# Patient Record
Sex: Male | Born: 1935 | Race: White | Hispanic: No | Marital: Married | Smoking: Never smoker
Health system: Southern US, Community
[De-identification: ages and names within clinical notes are randomized; demographics above are authoritative.]

## PROBLEM LIST (undated history)

## (undated) DIAGNOSIS — I10 Essential (primary) hypertension: Secondary | ICD-10-CM

## (undated) DIAGNOSIS — F0390 Unspecified dementia without behavioral disturbance: Secondary | ICD-10-CM

---

## 2020-02-18 ENCOUNTER — Ambulatory Visit: Payer: Self-pay | Admitting: Podiatry

## 2020-03-03 ENCOUNTER — Ambulatory Visit: Payer: Self-pay | Admitting: Podiatry

## 2020-03-10 ENCOUNTER — Ambulatory Visit: Payer: Self-pay | Admitting: Podiatry

## 2020-09-09 ENCOUNTER — Other Ambulatory Visit: Payer: Self-pay

## 2020-09-09 ENCOUNTER — Encounter: Payer: Self-pay | Admitting: *Deleted

## 2020-09-09 DIAGNOSIS — I10 Essential (primary) hypertension: Secondary | ICD-10-CM | POA: Insufficient documentation

## 2020-09-09 DIAGNOSIS — E7220 Disorder of urea cycle metabolism, unspecified: Secondary | ICD-10-CM | POA: Insufficient documentation

## 2020-09-09 DIAGNOSIS — B9689 Other specified bacterial agents as the cause of diseases classified elsewhere: Secondary | ICD-10-CM | POA: Diagnosis not present

## 2020-09-09 DIAGNOSIS — N390 Urinary tract infection, site not specified: Secondary | ICD-10-CM | POA: Insufficient documentation

## 2020-09-09 DIAGNOSIS — F0390 Unspecified dementia without behavioral disturbance: Secondary | ICD-10-CM | POA: Diagnosis not present

## 2020-09-09 LAB — COMPREHENSIVE METABOLIC PANEL
ALT: 21 U/L (ref 0–44)
AST: 27 U/L (ref 15–41)
Albumin: 4.1 g/dL (ref 3.5–5.0)
Alkaline Phosphatase: 46 U/L (ref 38–126)
Anion gap: 7 (ref 5–15)
BUN: 25 mg/dL — ABNORMAL HIGH (ref 8–23)
CO2: 27 mmol/L (ref 22–32)
Calcium: 9.4 mg/dL (ref 8.9–10.3)
Chloride: 104 mmol/L (ref 98–111)
Creatinine, Ser: 0.98 mg/dL (ref 0.61–1.24)
GFR, Estimated: >60 mL/min (ref >60)
Glucose, Bld: 157 mg/dL — ABNORMAL HIGH (ref 70–99)
Potassium: 4.2 mmol/L (ref 3.5–5.1)
Sodium: 138 mmol/L (ref 135–145)
Total Bilirubin: 0.8 mg/dL (ref 0.3–1.2)
Total Protein: 7 g/dL (ref 6.5–8.1)

## 2020-09-09 LAB — URINALYSIS, COMPLETE (UACMP) WITH MICROSCOPIC
Bacteria, UA: NONE SEEN
Bilirubin Urine: NEGATIVE
Glucose, UA: NEGATIVE mg/dL
Hgb urine dipstick: NEGATIVE
Ketones, ur: 5 mg/dL — AB
Nitrite: POSITIVE — AB
Protein, ur: NEGATIVE mg/dL
Specific Gravity, Urine: 1.026 (ref 1.005–1.030)
WBC, UA: >50 WBC/hpf — ABNORMAL HIGH (ref 0–5)
pH: 5 (ref 5.0–8.0)

## 2020-09-09 LAB — CBC
HCT: 40.2 % (ref 39.0–52.0)
Hemoglobin: 13.1 g/dL (ref 13.0–17.0)
MCH: 32 pg (ref 26.0–34.0)
MCHC: 32.6 g/dL (ref 30.0–36.0)
MCV: 98 fL (ref 80.0–100.0)
Platelets: 158 10*3/uL (ref 150–400)
RBC: 4.1 MIL/uL — ABNORMAL LOW (ref 4.22–5.81)
RDW: 12.4 % (ref 11.5–15.5)
WBC: 6.6 10*3/uL (ref 4.0–10.5)
nRBC: 0 % (ref 0.0–0.2)

## 2020-09-09 LAB — AMMONIA: Ammonia: 33 umol/L (ref 9–35)

## 2020-09-09 NOTE — ED Notes (Signed)
Pt ambulated to bathroom independently

## 2020-09-09 NOTE — ED Triage Notes (Signed)
Pt says he has to urinate a lot.

## 2020-09-09 NOTE — ED Triage Notes (Signed)
Pt arrives via [facility] from [facility] facility. Brought here for abnormal ammonia levels. Pt is alert, confused (hx of dementia). Responds only to "[facility]". Vitals 167/77, hr 78, sats 98%.

## 2020-09-09 NOTE — ED Notes (Signed)
Pt ambulated to bathroom independently to bathroom

## 2020-09-10 ENCOUNTER — Emergency Department
Admission: EM | Admit: 2020-09-10 | Discharge: 2020-09-10 | Disposition: A | Discharge status: Home / Self Care | Payer: Medicare PPO | Attending: Emergency Medicine | Admitting: Emergency Medicine

## 2020-09-10 DIAGNOSIS — N390 Urinary tract infection, site not specified: Secondary | ICD-10-CM

## 2020-09-10 HISTORY — DX: Unspecified dementia, unspecified severity, without behavioral disturbance, psychotic disturbance, mood disturbance, and anxiety: F03.90

## 2020-09-10 HISTORY — DX: Essential (primary) hypertension: I10

## 2020-09-10 MED ORDER — CEPHALEXIN 500 MG PO CAPS: 500.0000 mg | ORAL_CAPSULE | Freq: Two times a day (BID) | ORAL | 0 refills | Status: AC

## 2020-09-10 MED ORDER — CEPHALEXIN 500 MG PO CAPS
500.0000 mg | ORAL_CAPSULE | Freq: Once | ORAL | Status: AC
  Administered 2020-09-10: 500 mg via ORAL
  Filled 2020-09-10: qty 1

## 2020-09-10 NOTE — Discharge Instructions (Addendum)
Scott Montgomery ammonia level is normal today (33).  His other blood work is also normal.  His urine is showing findings of urinary tract infection.  We have prescribed a 7-day course of antibiotics.  He should return to the ER for any new or worsening urinary symptoms, abdominal or flank pain, fever, vomiting, weakness, or any other new or worsening symptoms that are concerning

## 2020-09-10 NOTE — ED Provider Notes (Signed)
[facility] Emergency Department Provider Note ____________________________________________   Event Date/Time   First MD Initiated Contact with Patient 09/10/20 0005     (approximate)  I have reviewed the triage vital signs and the nursing notes.   HISTORY  Chief Complaint Abnormal Labs  Level 5 caveat: History of present illness limited due to dementia  HPI Comer Devins is a 87 y.o. male with PMH as noted below including hypertension and dementia who presents from [facility] due to an apparent elevated ammonia level on recent labs there.  The patient himself initially stated that he felt fine and had no complaints although when I asked him further, he reported that he has been "urinating a lot."  He denies any pain, nausea or vomiting, or other acute symptoms.  Of note another patient was sent to the ED tonight from [facility] with an elevated ammonia level there and a normal level here.  Past Medical History:  Diagnosis Date   Dementia (HCC)    Hypertension     There are no problems to display for this patient.    Prior to Admission medications   Medication Sig Start Date End Date Taking? Authorizing Provider  cephALEXin (KEFLEX) 500 MG capsule Take 1 capsule (500 mg total) by mouth 2 (two) times daily for 7 days. 09/10/20 09/17/20 Yes Dionne Bucy, MD    Allergies Patient has no known allergies.  No family history on file.  Social History    Review of Systems Level 5 caveat: Unable to obtain review of systems due to dementia   ____________________________________________   PHYSICAL EXAM:  VITAL SIGNS: ED Triage Vitals  Enc Vitals Group     BP 09/09/20 1941 (!) 156/87     Pulse Rate 09/09/20 1941 63     Resp 09/09/20 1941 16     Temp 09/09/20 1941 98.6 F (37 C)     Temp Source 09/09/20 1941 Oral     SpO2 09/09/20 1941 96 %     Weight --      Height --      Head Circumference --      Peak Flow --       Pain Score 09/09/20 1940 0     Pain Loc --      Pain Edu? --      Excl. in GC? --     Constitutional: Alert, oriented x1.  Well appearing and in no acute distress. Eyes: Conjunctivae are normal.  Head: Atraumatic. Nose: No congestion/rhinnorhea. Mouth/Throat: Mucous membranes are moist.   Neck: Normal range of motion.  Cardiovascular: Normal rate, regular rhythm. Good peripheral circulation. Respiratory: Normal respiratory effort.  No retractions.  Gastrointestinal: Soft and nontender. No distention.  Genitourinary: No CVA tenderness. Musculoskeletal: Extremities warm and well perfused.  Neurologic:  Normal speech and language. No gross focal neurologic deficits are appreciated.  Skin:  Skin is warm and dry. No rash noted. Psychiatric: Mood and affect are normal. Speech and behavior are normal.  ____________________________________________   LABS (all labs ordered are listed, but only abnormal results are displayed)  Labs Reviewed  CBC - Abnormal; Notable for the following components:      Result Value   RBC 4.10 (*)    All other components within normal limits  COMPREHENSIVE METABOLIC PANEL - Abnormal; Notable for the following components:   Glucose, Bld 157 (*)    BUN 25 (*)    All other components within normal limits  URINALYSIS, COMPLETE (UACMP)  WITH MICROSCOPIC - Abnormal; Notable for the following components:   Color, Urine YELLOW (*)    APPearance HAZY (*)    Ketones, ur 5 (*)    Nitrite POSITIVE (*)    Leukocytes,Ua MODERATE (*)    WBC, UA >50 (*)    All other components within normal limits  AMMONIA   ____________________________________________  EKG   ____________________________________________  RADIOLOGY    ____________________________________________   PROCEDURES  Procedure(s) performed: No  Procedures  Critical Care performed: No ____________________________________________   INITIAL IMPRESSION / ASSESSMENT AND PLAN / ED  COURSE  Pertinent labs & imaging results that were available during my care of the patient were reviewed by me and considered in my medical decision making (see chart for details).   87 year old male with PMH as noted above presents from his facility after recent labs apparently revealed an elevated ammonia level.  It is not clear that the patient had any specific symptoms that were being worked up.  Of note, another patient was sent from [facility] tonight with the same issue and also had a normal ammonia here.  On exam, the patient is very well-appearing for his age.  He is alert, talkative, and ambulates with steady gait.  He is oriented only x1 although able to answer most questions and follow commands appropriately.  He does endorse some urinary frequency but denies other acute symptoms.  The ammonia level here is normal and the lab work-up is otherwise unremarkable except for the urinalysis, which shows significant WBCs and is positive for nitrites.  This is concerning for UTI.  The patient has no leukocytosis or fever, however given the fact that he does endorse urinary symptoms we will treat empirically for UTI.  At this time, the patient is stable for discharge back to his facility.  There is no indication for further the ED work-up or observation.  I will give a dose of Keflex here and discharged with a 1 week course of the same.  Return precautions and discharge instructions have been provided.  ____________________________________________   FINAL CLINICAL IMPRESSION(S) / ED DIAGNOSES  Final diagnoses:  Urinary tract infection without hematuria, site unspecified      NEW MEDICATIONS STARTED DURING THIS VISIT:  New Prescriptions   CEPHALEXIN (KEFLEX) 500 MG CAPSULE    Take 1 capsule (500 mg total) by mouth 2 (two) times daily for 7 days.     Note:  This document was prepared using Dragon voice recognition software and may include unintentional dictation errors.     Dionne Bucy, MD 09/10/20 [phone]

## 2020-09-10 NOTE — ED Notes (Signed)
Patient denies pain.

## 2020-10-16 ENCOUNTER — Emergency Department
Admission: EM | Admit: 2020-10-16 | Discharge: 2020-10-16 | Disposition: A | Discharge status: Home / Self Care | Payer: No Typology Code available for payment source | Source: Home / Self Care | Attending: Emergency Medicine | Admitting: Emergency Medicine

## 2020-10-16 ENCOUNTER — Emergency Department: Payer: No Typology Code available for payment source

## 2020-10-16 ENCOUNTER — Encounter: Payer: Self-pay | Admitting: Emergency Medicine

## 2020-10-16 ENCOUNTER — Other Ambulatory Visit: Payer: Self-pay

## 2020-10-16 DIAGNOSIS — F0391 Unspecified dementia with behavioral disturbance: Secondary | ICD-10-CM | POA: Diagnosis not present

## 2020-10-16 DIAGNOSIS — G9341 Metabolic encephalopathy: Secondary | ICD-10-CM | POA: Diagnosis not present

## 2020-10-16 DIAGNOSIS — R531 Weakness: Secondary | ICD-10-CM | POA: Insufficient documentation

## 2020-10-16 DIAGNOSIS — I10 Essential (primary) hypertension: Secondary | ICD-10-CM | POA: Insufficient documentation

## 2020-10-16 DIAGNOSIS — N3000 Acute cystitis without hematuria: Secondary | ICD-10-CM | POA: Insufficient documentation

## 2020-10-16 DIAGNOSIS — F0390 Unspecified dementia without behavioral disturbance: Secondary | ICD-10-CM | POA: Insufficient documentation

## 2020-10-16 LAB — URINALYSIS, COMPLETE (UACMP) WITH MICROSCOPIC
Bilirubin Urine: NEGATIVE
Glucose, UA: NEGATIVE mg/dL
Ketones, ur: 20 mg/dL — AB
Nitrite: POSITIVE — AB
Protein, ur: NEGATIVE mg/dL
Specific Gravity, Urine: 1.024 (ref 1.005–1.030)
Squamous Epithelial / HPF: NONE SEEN (ref 0–5)
pH: 5 (ref 5.0–8.0)

## 2020-10-16 LAB — BASIC METABOLIC PANEL
Anion gap: 10 (ref 5–15)
BUN: 23 mg/dL (ref 8–23)
CO2: 25 mmol/L (ref 22–32)
Calcium: 9.5 mg/dL (ref 8.9–10.3)
Chloride: 106 mmol/L (ref 98–111)
Creatinine, Ser: 1.03 mg/dL (ref 0.61–1.24)
GFR, Estimated: >60 mL/min (ref >60)
Glucose, Bld: 122 mg/dL — ABNORMAL HIGH (ref 70–99)
Potassium: 3.9 mmol/L (ref 3.5–5.1)
Sodium: 141 mmol/L (ref 135–145)

## 2020-10-16 LAB — CBC
HCT: 40 % (ref 39.0–52.0)
Hemoglobin: 13.5 g/dL (ref 13.0–17.0)
MCH: 32.8 pg (ref 26.0–34.0)
MCHC: 33.8 g/dL (ref 30.0–36.0)
MCV: 97.3 fL (ref 80.0–100.0)
Platelets: 153 10*3/uL (ref 150–400)
RBC: 4.11 MIL/uL — ABNORMAL LOW (ref 4.22–5.81)
RDW: 12.1 % (ref 11.5–15.5)
WBC: 9.4 10*3/uL (ref 4.0–10.5)
nRBC: 0 % (ref 0.0–0.2)

## 2020-10-16 MED ORDER — HALOPERIDOL LACTATE 5 MG/ML IJ SOLN
2.5000 mg | Freq: Once | INTRAMUSCULAR | Status: AC
  Administered 2020-10-16: 2.5 mg via INTRAVENOUS

## 2020-10-16 MED ORDER — LORAZEPAM 2 MG/ML IJ SOLN
0.5000 mg | Freq: Once | INTRAMUSCULAR | Status: AC
  Administered 2020-10-16: 0.5 mg via INTRAVENOUS
  Filled 2020-10-16: qty 1

## 2020-10-16 MED ORDER — HALOPERIDOL LACTATE 5 MG/ML IJ SOLN
2.5000 mg | Freq: Once | INTRAMUSCULAR | Status: DC
  Filled 2020-10-16: qty 1

## 2020-10-16 MED ORDER — HALOPERIDOL LACTATE 5 MG/ML IJ SOLN
2.0000 mg | Freq: Four times a day (QID) | INTRAMUSCULAR | Status: DC | PRN
  Administered 2020-10-16: 2 mg via INTRAVENOUS

## 2020-10-16 MED ORDER — CEPHALEXIN 500 MG PO CAPS: 500.0000 mg | ORAL_CAPSULE | Freq: Four times a day (QID) | ORAL | 0 refills | Status: DC

## 2020-10-16 MED ORDER — SODIUM CHLORIDE 0.9 % IV SOLN
1.0000 g | Freq: Once | INTRAVENOUS | Status: AC
  Administered 2020-10-16: 1 g via INTRAVENOUS
  Filled 2020-10-16: qty 10

## 2020-10-16 NOTE — ED Provider Notes (Signed)
[facility] Emergency Department Provider Note  ____________________________________________   Event Date/Time   First MD Initiated Contact with Patient 10/16/20 1334     (approximate)  I have reviewed the triage vital signs and the nursing notes.   HISTORY  Chief Complaint Weakness  HPI provided by facility staff. Patient reports he is here "for a check-up."  HPI Scott Montgomery is a 87 y.o. male with a history of dementia and hypertension, presents to the ED from [facility], assisted-living facility.  He presents to the ED via EMS with complaints of generalized weakness that began after lunch today.  According to staff, patient reported weakness with standing.  There was no reported syncope, speech deficit, paralysis.  Patient is at baseline according to EMS.  Patient denies any complaints at this time.  He does present with urine soaked pants, completely damp to the hymns with urine.  Patient declines offers to clean and change them at this time, remarking that "I like these pants, I am okay."  Discussed patient presentation with daughter-in-law, Johnny Bridge. She confirms that patient defiance and agitation is baseline. He has refused to allow staff to bathe him or change his clothes.     Past Medical History:  Diagnosis Date   Dementia (HCC)    Hypertension     There are no problems to display for this patient.   History reviewed. No pertinent surgical history.  Prior to Admission medications   Medication Sig Start Date End Date Taking? Authorizing Provider  cephALEXin (KEFLEX) 500 MG capsule Take 1 capsule (500 mg total) by mouth 4 (four) times daily for 5 days. 10/16/20 10/21/20 Yes Gilles Chiquito, MD    Allergies Patient has no known allergies.  History reviewed. No pertinent family history.  Social History    Review of Systems  Constitutional: No fever/chills Eyes: No visual changes. ENT: No sore throat. Cardiovascular:  Denies chest pain. Respiratory: Denies shortness of breath. Gastrointestinal: No abdominal pain.  No nausea, no vomiting.  No diarrhea.  No constipation. Genitourinary: Negative for dysuria. Musculoskeletal: Negative for back pain. Skin: Negative for rash. Neurological: Negative for headaches, focal weakness or numbness. ____________________________________________   PHYSICAL EXAM:  VITAL SIGNS: ED Triage Vitals  Enc Vitals Group     BP 10/16/20 1304 130/67     Pulse Rate 10/16/20 1304 97     Resp 10/16/20 1304 16     Temp 10/16/20 1304 98.6 F (37 C)     Temp Source 10/16/20 1304 Oral     SpO2 10/16/20 1304 97 %     Weight 10/16/20 1314 142 lb (64.4 kg)     Height 10/16/20 1314 5\' 5"  (1.651 m)     Head Circumference --      Peak Flow --      Pain Score 10/16/20 1314 0     Pain Loc --      Pain Edu? --      Excl. in GC? --     Constitutional: Alert and oriented. Well appearing and in no acute distress. Eyes: Conjunctivae are normal. PERRL. EOMI. Head: Atraumatic. Mouth/Throat: Mucous membranes are moist.  Oropharynx non-erythematous. Neck: No stridor.   Cardiovascular: Normal rate, regular rhythm. Grade II systolic murmur.  Good peripheral circulation. Respiratory: Normal respiratory effort.  No retractions. Lungs CTAB. Gastrointestinal: Soft and nontender. No distention. No abdominal bruits. No CVA tenderness. Musculoskeletal: No lower extremity tenderness nor edema.  No joint effusions. Neurologic:  Normal speech and language. No  gross focal neurologic deficits are appreciated. No gait instability. Skin:  Skin is warm, dry and intact. No rash noted. Hypertrophic toenails, dry peeling skin of feet.  Psychiatric: Mood and affect are normal. Speech is normal and behavior are defiant.   ____________________________________________   LABS (all labs ordered are listed, but only abnormal results are displayed)  Labs Reviewed  BASIC METABOLIC PANEL - Abnormal; Notable for  the following components:      Result Value   Glucose, Bld 122 (*)    All other components within normal limits  CBC - Abnormal; Notable for the following components:   RBC 4.11 (*)    All other components within normal limits  URINALYSIS, COMPLETE (UACMP) WITH MICROSCOPIC - Abnormal; Notable for the following components:   Color, Urine AMBER (*)    APPearance HAZY (*)    Hgb urine dipstick SMALL (*)    Ketones, ur 20 (*)    Nitrite POSITIVE (*)    Leukocytes,Ua MODERATE (*)    Bacteria, UA MANY (*)    All other components within normal limits  URINE CULTURE  CBG MONITORING, ED   ____________________________________________  EKG  Vent. rate 88 BPM PR interval 165 ms QRS duration 82 ms QT/QTcB 372/451 ms P-R-T axes 69 67 65 NSR No STEMI ____________________________________________  RADIOLOGY I, Lissa Hoard, personally viewed and evaluated these images (plain radiographs) as part of my medical decision making, as well as reviewing the written report by the radiologist.  ED MD interpretation:  agree with report  Official radiology report(s): CT Head Wo Contrast  Result Date: 10/16/2020 CLINICAL DATA:  Head trauma, minor (Age >= 65y) fall EXAM: CT HEAD WITHOUT CONTRAST TECHNIQUE: Contiguous axial images were obtained from the base of the skull through the vertex without intravenous contrast. COMPARISON:  None. FINDINGS: Brain: There is atrophy and chronic small vessel disease changes. No acute intracranial abnormality. Specifically, no hemorrhage, hydrocephalus, mass lesion, acute infarction, or significant intracranial injury. Vascular: No hyperdense vessel or unexpected calcification. Skull: No acute calvarial abnormality. Sinuses/Orbits: No acute findings Other: None IMPRESSION: Atrophy, chronic microvascular disease. No acute intracranial abnormality. Electronically Signed   By: Charlett Nose M.D.   On: 10/16/2020 16:42   CT Cervical Spine Wo Contrast  Result  Date: 10/16/2020 CLINICAL DATA:  Neck trauma (Age >= 65y).  Fall. EXAM: CT CERVICAL SPINE WITHOUT CONTRAST TECHNIQUE: Multidetector CT imaging of the cervical spine was performed without intravenous contrast. Multiplanar CT image reconstructions were also generated. COMPARISON:  None. FINDINGS: Alignment: No subluxation Skull base and vertebrae: No acute fracture. No primary bone lesion or focal pathologic process. Soft tissues and spinal canal: No prevertebral fluid or swelling. No visible canal hematoma. Disc levels: Diffuse degenerative disc disease, most pronounced from C5-6 through C7-T1. Moderate to advanced bilateral degenerative facet disease diffusely, left greater than right. Upper chest: No acute findings.  Biapical scarring. Other: None IMPRESSION: Degenerative disc and facet disease.  No acute bony abnormality. Electronically Signed   By: Charlett Nose M.D.   On: 10/16/2020 16:43   DG Chest Portable 1 View  Result Date: 10/16/2020 CLINICAL DATA:  Onset generalized weakness after lunch today. EXAM: PORTABLE CHEST 1 VIEW COMPARISON:  None. FINDINGS: There is mild elevation of the left hemidiaphragm relative to the right with some left basilar atelectasis. Lungs are otherwise clear. No pneumothorax or pleural fluid. Heart size is normal. The patient has a fracture of the posterior arc of the left sixth rib which is age indeterminate. IMPRESSION: No  acute disease. Age indeterminate fracture left sixth rib. Electronically Signed   By: Drusilla Kanner M.D.   On: 10/16/2020 15:24    ____________________________________________   PROCEDURES  Procedure(s) performed (including Critical Care):  Procedures  Rocephin 1 g IVPB Haloperidol 2 mg IVP Ativan 0.5 mg IV Haldol 2.5 mg IV. Lorazepam 0.5 mg IV ____________________________________________   INITIAL IMPRESSION / ASSESSMENT AND PLAN / ED COURSE  As part of my medical decision making, I reviewed the following data within the electronic  MEDICAL RECORD NUMBER History obtained from family, Labs reviewed UTI confirmed, EKG interpreted NSR, Radiograph reviewed WNL, Evaluated by EM attending C. Erma Heritage, MD, and Notes from prior ED visits   Differential diagnosis includes, but is not limited to, alcohol, illicit or prescription medications, or other toxic ingestion; intracranial pathology such as stroke or intracerebral hemorrhage; fever or infectious causes including sepsis; hypoxemia and/or hypercarbia; uremia; trauma; endocrine related disorders such as diabetes, hypoglycemia, and thyroid-related diseases; hypertensive encephalopathy; etc.  Geriatric patient presents to the ED for evaluation of weakness.  Patient did become agitated and combative with staff attempting to evaluate him.  He was given Haldol lorazepam in the ED to help with his agitation.  He has been stable since that time.  He was evaluated for his complaints, despite being a poor historian.  He was found to have a normal head and neck CT and the chest is rated at revealing acute intrathoracic process.  His urinalysis did reveal some mild bacteria.  Patient was treated empirically for UTI at this time.  He is discharged otherwise stable condition back to his facility for routine follow-up with his PCP. ____________________________________________   FINAL CLINICAL IMPRESSION(S) / ED DIAGNOSES  Final diagnoses:  Acute cystitis without hematuria     ED Discharge Orders          Ordered    cephALEXin (KEFLEX) 500 MG capsule  4 times daily        10/16/20 1652             Note:  This document was prepared using Dragon voice recognition software and may include unintentional dictation errors.    Lissa Hoard, PA-C 10/16/20 2017    Shaune Pollack, MD 10/16/20 2157

## 2020-10-16 NOTE — ED Notes (Signed)
Called [facility] for transport to [facility]

## 2020-10-16 NOTE — ED Notes (Signed)
Pt pulled out IV from right wrist. Tech cleaned the site an now pt is back sleeping waiting on [facility] to pick him up to go back to [facility]

## 2020-10-16 NOTE — ED Notes (Signed)
RN attempted to call Report to [facility]. Talked with Tresa Endo who stated no pt with this last name is a resident there. Charge RN made aware.

## 2020-10-16 NOTE — ED Notes (Signed)
Patient is now sleeping, waiting for transport.

## 2020-10-16 NOTE — ED Notes (Signed)
Recruitment consultant (ED tech) at bedside. Patient is waiting for transportation back to SNF. Patient is restless on the stretcher, throwing legs over the side. Patient has removed his B/P cuff and cardiac leads.

## 2020-10-16 NOTE — ED Notes (Signed)
EMS is here for patient.

## 2020-10-16 NOTE — ED Notes (Signed)
This RN called and updated daughter in law Johnny Bridge. Confirmed that pt is from [facility].

## 2020-10-16 NOTE — ED Notes (Signed)
Pt is from [facility]. Report called to [facility].

## 2020-10-16 NOTE — ED Triage Notes (Signed)
Pt in from [facility] via [facility] with generalized weakness that began when getting up after lunch today. States he woke up normal, but got weak standing after lunch. Bilateral weakness in both legs, equal grip strength and speech clear baseline per EMS

## 2020-10-16 NOTE — ED Notes (Signed)
Pt laying in bed. Eyes closed and resting.

## 2020-10-16 NOTE — ED Notes (Signed)
Pulled patients pants back up. PT states he came to [place] to buy a car and now he is ready to go home. Attempts to reorient patient unsuccessful.

## 2020-10-17 ENCOUNTER — Emergency Department: Payer: No Typology Code available for payment source

## 2020-10-17 ENCOUNTER — Other Ambulatory Visit: Payer: Self-pay

## 2020-10-17 ENCOUNTER — Inpatient Hospital Stay
Admission: EM | Admit: 2020-10-17 | Discharge: 2020-10-20 | DRG: 884 | Disposition: A | Discharge status: Skilled Nursing Facility | Payer: No Typology Code available for payment source | Source: Skilled Nursing Facility | Attending: Internal Medicine | Admitting: Internal Medicine

## 2020-10-17 DIAGNOSIS — Z66 Do not resuscitate: Secondary | ICD-10-CM | POA: Diagnosis present

## 2020-10-17 DIAGNOSIS — R778 Other specified abnormalities of plasma proteins: Secondary | ICD-10-CM | POA: Diagnosis present

## 2020-10-17 DIAGNOSIS — F03918 Unspecified dementia, unspecified severity, with other behavioral disturbance: Secondary | ICD-10-CM | POA: Diagnosis present

## 2020-10-17 DIAGNOSIS — Z681 Body mass index (BMI) 19 or less, adult: Secondary | ICD-10-CM

## 2020-10-17 DIAGNOSIS — N210 Calculus in bladder: Secondary | ICD-10-CM | POA: Diagnosis present

## 2020-10-17 DIAGNOSIS — E43 Unspecified severe protein-calorie malnutrition: Secondary | ICD-10-CM | POA: Insufficient documentation

## 2020-10-17 DIAGNOSIS — G4700 Insomnia, unspecified: Secondary | ICD-10-CM | POA: Diagnosis present

## 2020-10-17 DIAGNOSIS — N3000 Acute cystitis without hematuria: Secondary | ICD-10-CM

## 2020-10-17 DIAGNOSIS — R8271 Bacteriuria: Secondary | ICD-10-CM | POA: Diagnosis present

## 2020-10-17 DIAGNOSIS — R531 Weakness: Secondary | ICD-10-CM

## 2020-10-17 DIAGNOSIS — R52 Pain, unspecified: Secondary | ICD-10-CM

## 2020-10-17 DIAGNOSIS — F411 Generalized anxiety disorder: Secondary | ICD-10-CM | POA: Diagnosis present

## 2020-10-17 DIAGNOSIS — I10 Essential (primary) hypertension: Secondary | ICD-10-CM

## 2020-10-17 DIAGNOSIS — E785 Hyperlipidemia, unspecified: Secondary | ICD-10-CM | POA: Diagnosis present

## 2020-10-17 DIAGNOSIS — E538 Deficiency of other specified B group vitamins: Secondary | ICD-10-CM | POA: Diagnosis present

## 2020-10-17 DIAGNOSIS — Y92099 Unspecified place in other non-institutional residence as the place of occurrence of the external cause: Secondary | ICD-10-CM

## 2020-10-17 DIAGNOSIS — F0390 Unspecified dementia without behavioral disturbance: Secondary | ICD-10-CM

## 2020-10-17 DIAGNOSIS — R410 Disorientation, unspecified: Secondary | ICD-10-CM | POA: Diagnosis present

## 2020-10-17 DIAGNOSIS — F0391 Unspecified dementia with behavioral disturbance: Principal | ICD-10-CM | POA: Diagnosis present

## 2020-10-17 DIAGNOSIS — Z20822 Contact with and (suspected) exposure to covid-19: Secondary | ICD-10-CM | POA: Diagnosis present

## 2020-10-17 DIAGNOSIS — S0003XA Contusion of scalp, initial encounter: Secondary | ICD-10-CM | POA: Diagnosis present

## 2020-10-17 DIAGNOSIS — F32A Depression, unspecified: Secondary | ICD-10-CM | POA: Diagnosis present

## 2020-10-17 DIAGNOSIS — Z79899 Other long term (current) drug therapy: Secondary | ICD-10-CM

## 2020-10-17 DIAGNOSIS — N390 Urinary tract infection, site not specified: Secondary | ICD-10-CM | POA: Diagnosis present

## 2020-10-17 DIAGNOSIS — W19XXXA Unspecified fall, initial encounter: Secondary | ICD-10-CM | POA: Diagnosis present

## 2020-10-17 DIAGNOSIS — W1830XA Fall on same level, unspecified, initial encounter: Secondary | ICD-10-CM | POA: Diagnosis present

## 2020-10-17 DIAGNOSIS — R7989 Other specified abnormal findings of blood chemistry: Secondary | ICD-10-CM | POA: Diagnosis present

## 2020-10-17 DIAGNOSIS — M6282 Rhabdomyolysis: Secondary | ICD-10-CM | POA: Diagnosis present

## 2020-10-17 DIAGNOSIS — N400 Enlarged prostate without lower urinary tract symptoms: Secondary | ICD-10-CM | POA: Diagnosis present

## 2020-10-17 DIAGNOSIS — G9341 Metabolic encephalopathy: Secondary | ICD-10-CM | POA: Diagnosis present

## 2020-10-17 DIAGNOSIS — R569 Unspecified convulsions: Secondary | ICD-10-CM | POA: Diagnosis present

## 2020-10-17 DIAGNOSIS — Z8719 Personal history of other diseases of the digestive system: Secondary | ICD-10-CM

## 2020-10-17 DIAGNOSIS — Z87898 Personal history of other specified conditions: Secondary | ICD-10-CM

## 2020-10-17 DIAGNOSIS — L89896 Pressure-induced deep tissue damage of other site: Secondary | ICD-10-CM | POA: Diagnosis present

## 2020-10-17 LAB — URINALYSIS, COMPLETE (UACMP) WITH MICROSCOPIC
Bilirubin Urine: NEGATIVE
Glucose, UA: NEGATIVE mg/dL
Ketones, ur: 20 mg/dL — AB
Nitrite: POSITIVE — AB
Protein, ur: 30 mg/dL — AB
RBC / HPF: >50 RBC/hpf — ABNORMAL HIGH (ref 0–5)
Specific Gravity, Urine: 1.024 (ref 1.005–1.030)
pH: 6 (ref 5.0–8.0)

## 2020-10-17 LAB — CBC WITH DIFFERENTIAL/PLATELET
Abs Immature Granulocytes: 0.07 10*3/uL (ref 0.00–0.07)
Basophils Absolute: 0 10*3/uL (ref 0.0–0.1)
Basophils Relative: 0 %
Eosinophils Absolute: 0 10*3/uL (ref 0.0–0.5)
Eosinophils Relative: 0 %
HCT: 40.8 % (ref 39.0–52.0)
Hemoglobin: 13.5 g/dL (ref 13.0–17.0)
Immature Granulocytes: 1 %
Lymphocytes Relative: 3 %
Lymphs Abs: 0.5 10*3/uL — ABNORMAL LOW (ref 0.7–4.0)
MCH: 32.5 pg (ref 26.0–34.0)
MCHC: 33.1 g/dL (ref 30.0–36.0)
MCV: 98.3 fL (ref 80.0–100.0)
Monocytes Absolute: 1.6 10*3/uL — ABNORMAL HIGH (ref 0.1–1.0)
Monocytes Relative: 12 %
Neutro Abs: 11.4 10*3/uL — ABNORMAL HIGH (ref 1.7–7.7)
Neutrophils Relative %: 84 %
Platelets: 147 10*3/uL — ABNORMAL LOW (ref 150–400)
RBC: 4.15 MIL/uL — ABNORMAL LOW (ref 4.22–5.81)
RDW: 12.2 % (ref 11.5–15.5)
WBC: 13.5 10*3/uL — ABNORMAL HIGH (ref 4.0–10.5)
nRBC: 0 % (ref 0.0–0.2)

## 2020-10-17 LAB — COMPREHENSIVE METABOLIC PANEL
ALT: 37 U/L (ref 0–44)
AST: 117 U/L — ABNORMAL HIGH (ref 15–41)
Albumin: 3.9 g/dL (ref 3.5–5.0)
Alkaline Phosphatase: 39 U/L (ref 38–126)
Anion gap: 14 (ref 5–15)
BUN: 31 mg/dL — ABNORMAL HIGH (ref 8–23)
CO2: 26 mmol/L (ref 22–32)
Calcium: 10 mg/dL (ref 8.9–10.3)
Chloride: 103 mmol/L (ref 98–111)
Creatinine, Ser: 1.07 mg/dL (ref 0.61–1.24)
GFR, Estimated: >60 mL/min (ref >60)
Glucose, Bld: 128 mg/dL — ABNORMAL HIGH (ref 70–99)
Potassium: 3.9 mmol/L (ref 3.5–5.1)
Sodium: 143 mmol/L (ref 135–145)
Total Bilirubin: 1.4 mg/dL — ABNORMAL HIGH (ref 0.3–1.2)
Total Protein: 6.4 g/dL — ABNORMAL LOW (ref 6.5–8.1)

## 2020-10-17 LAB — APTT: aPTT: 26 seconds (ref 24–36)

## 2020-10-17 LAB — CBC
HCT: 38.2 % — ABNORMAL LOW (ref 39.0–52.0)
Hemoglobin: 12.7 g/dL — ABNORMAL LOW (ref 13.0–17.0)
MCH: 33.3 pg (ref 26.0–34.0)
MCHC: 33.2 g/dL (ref 30.0–36.0)
MCV: 100.3 fL — ABNORMAL HIGH (ref 80.0–100.0)
Platelets: 131 10*3/uL — ABNORMAL LOW (ref 150–400)
RBC: 3.81 MIL/uL — ABNORMAL LOW (ref 4.22–5.81)
RDW: 12.4 % (ref 11.5–15.5)
WBC: 10.8 10*3/uL — ABNORMAL HIGH (ref 4.0–10.5)
nRBC: 0 % (ref 0.0–0.2)

## 2020-10-17 LAB — TROPONIN I (HIGH SENSITIVITY)
Troponin I (High Sensitivity): 250 ng/L (ref <18)
Troponin I (High Sensitivity): 263 ng/L (ref <18)
Troponin I (High Sensitivity): 328 ng/L (ref <18)
Troponin I (High Sensitivity): 384 ng/L (ref <18)
Troponin I (High Sensitivity): 332 ng/L (ref <18)

## 2020-10-17 LAB — RESP PANEL BY RT-PCR (FLU A&B, COVID) ARPGX2
Influenza A by PCR: NEGATIVE
Influenza B by PCR: NEGATIVE
SARS Coronavirus 2 by RT PCR: NEGATIVE

## 2020-10-17 LAB — MAGNESIUM: Magnesium: 2.1 mg/dL (ref 1.7–2.4)

## 2020-10-17 LAB — PROTIME-INR
INR: 1 (ref 0.8–1.2)
Prothrombin Time: 13.6 seconds (ref 11.4–15.2)

## 2020-10-17 LAB — CK: Total CK: 4562 U/L — ABNORMAL HIGH (ref 49–397)

## 2020-10-17 LAB — TSH: TSH: 1.498 u[IU]/mL (ref 0.350–4.500)

## 2020-10-17 MED ORDER — HEPARIN BOLUS VIA INFUSION
3000.0000 [IU] | Freq: Once | INTRAVENOUS | Status: DC
  Filled 2020-10-17: qty 3000

## 2020-10-17 MED ORDER — SODIUM CHLORIDE 0.9 % IV BOLUS
1000.0000 mL | Freq: Once | INTRAVENOUS | Status: AC
  Administered 2020-10-17: 1000 mL via INTRAVENOUS

## 2020-10-17 MED ORDER — ONDANSETRON HCL 4 MG PO TABS: 4.0000 mg | ORAL_TABLET | Freq: Four times a day (QID) | ORAL | Status: DC | PRN

## 2020-10-17 MED ORDER — SODIUM CHLORIDE 0.9 % IV SOLN
1.0000 g | Freq: Once | INTRAVENOUS | Status: AC
  Administered 2020-10-17: 1 g via INTRAVENOUS
  Filled 2020-10-17: qty 10

## 2020-10-17 MED ORDER — HALOPERIDOL LACTATE 5 MG/ML IJ SOLN
2.0000 mg | Freq: Four times a day (QID) | INTRAMUSCULAR | Status: AC | PRN
Start: 2020-10-17 — End: 2020-10-19
  Administered 2020-10-17 – 2020-10-19 (×2): 2 mg via INTRAVENOUS
  Filled 2020-10-17 (×2): qty 1

## 2020-10-17 MED ORDER — LORAZEPAM 2 MG/ML IJ SOLN
1.0000 mg | Freq: Once | INTRAMUSCULAR | Status: AC
  Administered 2020-10-17: 1 mg via INTRAVENOUS
  Filled 2020-10-17: qty 1

## 2020-10-17 MED ORDER — ACETAMINOPHEN 650 MG RE SUPP
650.0000 mg | Freq: Four times a day (QID) | RECTAL | Status: AC | PRN
  Administered 2020-10-17: 650 mg via RECTAL
  Filled 2020-10-17: qty 1

## 2020-10-17 MED ORDER — RISPERIDONE 1 MG PO TABS
1.0000 mg | ORAL_TABLET | Freq: Every day | ORAL | Status: DC
  Administered 2020-10-18 – 2020-10-19 (×2): 1 mg via ORAL
  Filled 2020-10-17 (×4): qty 1

## 2020-10-17 MED ORDER — ACETAMINOPHEN 325 MG PO TABS: 650.0000 mg | ORAL_TABLET | Freq: Four times a day (QID) | ORAL | Status: AC | PRN

## 2020-10-17 MED ORDER — FLUOXETINE HCL 20 MG PO CAPS
20.0000 mg | ORAL_CAPSULE | Freq: Every day | ORAL | Status: DC
  Administered 2020-10-18 – 2020-10-20 (×3): 20 mg via ORAL
  Filled 2020-10-17 (×3): qty 1

## 2020-10-17 MED ORDER — LORAZEPAM 2 MG/ML IJ SOLN
1.0000 mg | INTRAMUSCULAR | Status: DC | PRN
  Administered 2020-10-17: 1 mg via INTRAVENOUS
  Filled 2020-10-17: qty 1

## 2020-10-17 MED ORDER — ONDANSETRON HCL 4 MG/2ML IJ SOLN
4.0000 mg | Freq: Four times a day (QID) | INTRAMUSCULAR | Status: DC | PRN
Start: 2020-10-17 — End: 2020-10-20

## 2020-10-17 MED ORDER — ACETAMINOPHEN 500 MG PO TABS
1000.0000 mg | ORAL_TABLET | Freq: Once | ORAL | Status: DC
Start: 2020-10-17 — End: 2020-10-17

## 2020-10-17 MED ORDER — TAMSULOSIN HCL 0.4 MG PO CAPS
0.4000 mg | ORAL_CAPSULE | Freq: Every day | ORAL | Status: DC
  Administered 2020-10-18 – 2020-10-20 (×3): 0.4 mg via ORAL
  Filled 2020-10-17 (×3): qty 1

## 2020-10-17 MED ORDER — HALOPERIDOL LACTATE 5 MG/ML IJ SOLN
2.0000 mg | Freq: Once | INTRAMUSCULAR | Status: AC
  Administered 2020-10-17: 2 mg via INTRAVENOUS
  Filled 2020-10-17: qty 1

## 2020-10-17 MED ORDER — SODIUM CHLORIDE 0.9 % IV SOLN
2.0000 g | INTRAVENOUS | Status: DC
  Filled 2020-10-17: qty 20

## 2020-10-17 MED ORDER — DIVALPROEX SODIUM 125 MG PO CSDR
125.0000 mg | DELAYED_RELEASE_CAPSULE | Freq: Two times a day (BID) | ORAL | Status: DC
  Administered 2020-10-18 – 2020-10-20 (×5): 125 mg via ORAL
  Filled 2020-10-17 (×7): qty 1

## 2020-10-17 MED ORDER — ENOXAPARIN SODIUM 40 MG/0.4ML IJ SOSY: 40.0000 mg | PREFILLED_SYRINGE | INTRAMUSCULAR | Status: DC

## 2020-10-17 MED ORDER — TRAZODONE HCL 100 MG PO TABS
100.0000 mg | ORAL_TABLET | Freq: Every day | ORAL | Status: DC
  Filled 2020-10-17: qty 1

## 2020-10-17 MED ORDER — HEPARIN (PORCINE) 25000 UT/250ML-% IV SOLN
600.0000 [IU]/h | INTRAVENOUS | Status: DC
  Administered 2020-10-17: 600 [IU]/h via INTRAVENOUS
  Filled 2020-10-17 (×2): qty 250

## 2020-10-17 NOTE — H&P (Signed)
History and Physical   Scott Montgomery SWF:[id number] DOB: 23-Mar-1929 DOA: 10/17/2020  PCP: [facility] Patient coming from: Facility  I have personally briefly reviewed patient's old medical records in [place].  Chief Concern: Unwitnessed fall  HPI: Scott Montgomery is a 87 y.o. male with medical history significant for hyperlipidemia, BPH, hypertension, cataracts, history of colon polyps, dementia, erectile dysfunction, generalized anxiety disorder, osteoarthritis, pseudophakia, presents to the emergency department from facility for chief concerns of altered mentation.  At bedside patient is able to tell me his name with difficulty.  He was able to tell me his age.  He does attempt to take out his lines and IV.  Bilateral mittens are in place.  Social history: He currently lives at facility. His wife lives at home.  ROS: Unable to complete as patient is mildly agitated, and acute confusion with dementia  ED Course: Discussed with emergency medicine provider, patient requiring hospitalization for confusion in setting of urinary tract infection.  Vitals in the emergency department was remarkable for temperature 98.5, respiration rate of 18, heart rate 85, blood pressure 138/71, SPO2 of 96% on room air.  Labs in the emergency department was remarkable for WBC 13.5, hemoglobin 13.3, platelets 147, sodium 143, potassium 3.9, chloride 103, bicarb 26, BUN 31, serum creatinine of 1.07, nonfasting blood glucose 128, EGFR greater than 60, CK 4562, troponin 250 and increased to 263.  UA was positive for nitrates and moderate leukocytes.  Urine culture has been collected and is pending.  COVID/influenza A/influenza B were negative.  EDP attempted to transfer patient to the [place] however the [place] is on diversion and declined patient.  Patient had several episodes of agitation and attempting to remove IV, patient was given Haldol 2 mg IV once, Ativan 1 mg IV  once, ceftriaxone 1 mg IV once, normal saline 1 L bolus.  Assessment/Plan  Principal Problem:   Confusion Active Problems:   Fall   Dementia [name])   Essential hypertension   Elevated troponin   Acute lower UTI   History of seizure   BPH (benign prostatic hyperplasia)   Dementia with behavioral disturbance (HCC)   # Acute altered mentation secondary to dementia - Found down and moving all extremities at this time - CT of the head without contrast was read as left posterior convexity scalp hematoma without underlying skull fracture.  No acute intracranial abnormality - Suspect secondary to UTI in setting of patient with BPH - Was discharged home on Keflex p.o. antibiotics on 10/16/2020 - Ceftriaxone IV daily initiated  # Elevated troponin-with positive delta, patient denies chest pain at bedside and shortness of breath - EKG was unremarkable for ST-T wave changes - Initial heparin bolus and GTT was ordered however given that patient no known history of CAD and with the scalp hematoma I discontinued initial heparin - I am repeating troponins high-sensitivity for 2 more times - If continues to trend up with overt elevated delta, I would recommend a.m. team to resume heparin - Complete echo was ordered - Recheck CBC - I would recommend a.m. team to consult cardiology for further recommendation  # Dementia with agitation-Haloperidol 2 mg IV every 6 hours.  For agitation, 2 doses ordered, patient responded well with Ativan - Ativan 1 mg IV every 6 hours.  For anxiety, agitation - A.m. team to consider consultation to palliative care in the a.m. and try to reach out to family members - I was able to get wife on the  phone however she asked me, "why are you calling me I do not want to talk to you?"  # Hyperlipidemia on simvastatin 40 mg previously,  # History of seizure - On Depakote 125 mg p.o. twice daily, seizure precaution  # B12 deficiency  # BPH-finasteride 5 mg daily,  tamsulosin 0.5 mg daily terazosin 10 mg daily, # Depression/anxiety-fluoxetine 20 mg daily resumed # Insomnia-trazodone 50 mg daily at bedtime  Chart reviewed.   DVT prophylaxis: TED hose Code Status: Full code Diet: Mechanical soft diet with pudding thick Family Communication: Attempted to call spouse, Scott Montgomery at [phone].  Patient's spouse was agitated and states that she does not know why I am calling her about this patient.  She did acknowledge that the patient is her husband.  She states that she does not want to talk to me.  She then hung up on me. Disposition Plan: Pending clinical course Consults called: None at this time Admission status: MedSurg, observation, telemetry ordered  Past Medical History:  Diagnosis Date   Dementia ([place])    Hypertension    History reviewed. No pertinent surgical history.  Social History:  reports previous alcohol use. He reports that he does not use drugs. No history on file for tobacco use.  No Known Allergies History reviewed. No pertinent family history. Family history: Family history reviewed and not pertinent  Prior to Admission medications   Medication Sig Start Date End Date Taking? Authorizing Provider  acetaminophen (TYLENOL) 325 MG tablet Take 650 mg by mouth 3 (three) times daily.   Yes [provider]  cholecalciferol (VITAMIN D) 25 MCG (1000 UNIT) tablet Take 2,000 Units by mouth daily.   Yes [provider]  divalproex (DEPAKOTE SPRINKLE) 125 MG capsule Take 125 mg by mouth 2 (two) times daily.   Yes [provider]  FLUoxetine (PROZAC) 20 MG capsule Take 20 mg by mouth daily.   Yes [provider]  methylphenidate (RITALIN) 5 MG tablet Take 5 mg by mouth 2 (two) times daily.   Yes [provider]  risperiDONE (RISPERDAL) 1 MG tablet Take 1 mg by mouth at bedtime.   Yes [provider]  senna-docusate (SENOKOT-S) 8.6-50 MG tablet Take 2 tablets by mouth 2 (two) times  daily.   Yes [provider]  tamsulosin (FLOMAX) 0.4 MG CAPS capsule Take 0.4 mg by mouth.   Yes [provider]  traZODone (DESYREL) 100 MG tablet Take 100 mg by mouth at bedtime.   Yes [provider]  vitamin B-12 (CYANOCOBALAMIN) 1000 MCG tablet Take 1,000 mcg by mouth daily.   Yes [provider]  cephALEXin (KEFLEX) 500 MG capsule Take 1 capsule (500 mg total) by mouth 4 (four) times daily for 5 days. 10/16/20 10/21/20  Lucrezia Starch, MD   Physical Exam: Vitals:   10/17/20 1600 10/17/20 1630 10/17/20 1746 10/17/20 1946  BP: (!) 113/59 105/62 129/66 126/83  Pulse:   (!) 53 (!) 51  Resp: (!) 23 (!) $Remo'24 18 18  'GOLWa$ Temp:  99.8 F (37.7 C) (!) 97.5 F (36.4 C) 97.6 F (36.4 C)  TempSrc:  Axillary    SpO2:   95% 100%  Weight:      Height:       Constitutional: appears frail, cachectic appearing, he looks 87 years old, NAD, calm, comfortable Eyes: PERRL, lids and conjunctivae normal ENMT: Mucous membranes are moist. Posterior pharynx clear of any exudate or lesions.  Poor dentition.  Mild hearing loss Neck: normal, supple,  no masses, no thyromegaly Respiratory: clear to auscultation bilaterally, no wheezing, no crackles. Normal respiratory effort. No accessory muscle use.  Cardiovascular: Regular rate and rhythm, no murmurs / rubs / gallops. No extremity edema. 2+ pedal pulses. No carotid bruits.  Abdomen: Scaphoid abdomen, no tenderness, no masses palpated, no hepatosplenomegaly. Bowel sounds positive.  Musculoskeletal: no clubbing / cyanosis. No joint deformity upper and lower extremities. Good ROM, no contractures, no atrophy. Normal muscle tone.  Skin: no rashes, lesions, ulcers. No induration Neurologic: Sensation intact. Strength 5/5 in all 4.  Psychiatric: Patient has dementia and lacks judgment and insight. Alert and oriented x self and age.  Agitated and anxious mood  EKG: independently reviewed, showing sinus rhythm with rate of 81, QTc 459,  LVH  Chest x-ray on Admission: I personally reviewed and I agree with radiologist reading as below.  DG Chest 1 View  Result Date: 10/17/2020 CLINICAL DATA:  Un witnessed fall. EXAM: CHEST  1 VIEW COMPARISON:  10/16/20. FINDINGS: The heart size and mediastinal contours are within normal limits. Both lungs are clear. The visualized skeletal structures are unremarkable. Age-indeterminate left posterior 6 rib fracture is stable from previous exam. IMPRESSION: No active cardiopulmonary abnormalities. Electronically Signed   By: Kerby Moors M.D.   On: 10/17/2020 10:08   DG Elbow 2 Views Left  Result Date: 10/17/2020 CLINICAL DATA:  Un witnessed fall. EXAM: LEFT ELBOW - 2 VIEW COMPARISON:  None. FINDINGS: There is no evidence of fracture, dislocation, or joint effusion. There is no evidence of arthropathy or other focal bone abnormality. Soft tissues are unremarkable. IMPRESSION: Negative. Electronically Signed   By: Kerby Moors M.D.   On: 10/17/2020 10:15   CT Head Wo Contrast  Result Date: 10/17/2020 CLINICAL DATA:  87 year old male found down. EXAM: CT HEAD WITHOUT CONTRAST TECHNIQUE: Contiguous axial images were obtained from the base of the skull through the vertex without intravenous contrast. COMPARISON:  Head and cervical spine CT yesterday. FINDINGS: Brain: Stable cerebral volume, with disproportionate mesial temporal lobe atrophy. Stable ventricle size and configuration. No superimposed No midline shift, mass effect, evidence of mass lesion, intracranial hemorrhage or evidence of cortically based acute infarction. Stable gray-white matter differentiation throughout the brain. Vascular: Calcified atherosclerosis at the skull base. No suspicious intracranial vascular hyperdensity. Skull: Stable and intact. Sinuses/Orbits: Mild to moderate ethmoid and maxillary sinus mucosal thickening is stable. Tympanic cavities and mastoids remain clear. Other: Broad-based left posterior convexity scalp  hematoma is new on series 4, image 61. Underlying calvarium appears stable and intact. Other orbit and scalp soft tissues appear stable. IMPRESSION: 1. Left posterior convexity scalp hematoma without underlying skull fracture. 2. No acute intracranial abnormality, stable brain since yesterday. Electronically Signed   By: Genevie Ann M.D.   On: 10/17/2020 09:17   CT Head Wo Contrast  Result Date: 10/16/2020 CLINICAL DATA:  Head trauma, minor (Age >= 65y) fall EXAM: CT HEAD WITHOUT CONTRAST TECHNIQUE: Contiguous axial images were obtained from the base of the skull through the vertex without intravenous contrast. COMPARISON:  None. FINDINGS: Brain: There is atrophy and chronic small vessel disease changes. No acute intracranial abnormality. Specifically, no hemorrhage, hydrocephalus, mass lesion, acute infarction, or significant intracranial injury. Vascular: No hyperdense vessel or unexpected calcification. Skull: No acute calvarial abnormality. Sinuses/Orbits: No acute findings Other: None IMPRESSION: Atrophy, chronic microvascular disease. No acute intracranial abnormality. Electronically Signed   By: Rolm Baptise M.D.   On: 10/16/2020 16:42   CT Cervical Spine Wo Contrast  Result Date:  10/17/2020 CLINICAL DATA:  87 year old male found down. EXAM: CT CERVICAL SPINE WITHOUT CONTRAST TECHNIQUE: Multidetector CT imaging of the cervical spine was performed without intravenous contrast. Multiplanar CT image reconstructions were also generated. COMPARISON:  CT head today.  CT head and cervical spine yesterday. FINDINGS: Alignment: Stable straightening of cervical lordosis. Cervicothoracic junction alignment is within normal limits. Bilateral posterior element alignment is within normal limits. Skull base and vertebrae: Central skull base appears stable and intact. C1 and C2 appear stable and intact. Stable visualized osseous structures. Soft tissues and spinal canal: No prevertebral fluid or swelling. No visible  canal hematoma. Small volume retained secretions in the hypopharynx. Otherwise negative noncontrast visible neck soft tissues. Disc levels: Bulky degenerative ligamentous hypertrophy about the odontoid with mild cervicomedullary junction spinal stenosis. Advanced cervical spine degeneration elsewhere, including bulky disc degeneration at C4-C5 with suspected mild to moderate spinal stenosis there, at C5-C6. Upper chest: Visible upper thoracic levels and lung apices are stable. IMPRESSION: 1. No acute traumatic injury identified in the cervical spine. 2. Stable advanced cervical spine degeneration with suspected degenerative spinal stenosis at the cervicomedullary junction, C4-C5, and C5-C6. Electronically Signed   By: Genevie Ann M.D.   On: 10/17/2020 09:20   CT Cervical Spine Wo Contrast  Result Date: 10/16/2020 CLINICAL DATA:  Neck trauma (Age >= 65y).  Fall. EXAM: CT CERVICAL SPINE WITHOUT CONTRAST TECHNIQUE: Multidetector CT imaging of the cervical spine was performed without intravenous contrast. Multiplanar CT image reconstructions were also generated. COMPARISON:  None. FINDINGS: Alignment: No subluxation Skull base and vertebrae: No acute fracture. No primary bone lesion or focal pathologic process. Soft tissues and spinal canal: No prevertebral fluid or swelling. No visible canal hematoma. Disc levels: Diffuse degenerative disc disease, most pronounced from C5-6 through C7-T1. Moderate to advanced bilateral degenerative facet disease diffusely, left greater than right. Upper chest: No acute findings.  Biapical scarring. Other: None IMPRESSION: Degenerative disc and facet disease.  No acute bony abnormality. Electronically Signed   By: Rolm Baptise M.D.   On: 10/16/2020 16:43   DG Chest Portable 1 View  Result Date: 10/16/2020 CLINICAL DATA:  Onset generalized weakness after lunch today. EXAM: PORTABLE CHEST 1 VIEW COMPARISON:  None. FINDINGS: There is mild elevation of the left hemidiaphragm relative to  the right with some left basilar atelectasis. Lungs are otherwise clear. No pneumothorax or pleural fluid. Heart size is normal. The patient has a fracture of the posterior arc of the left sixth rib which is age indeterminate. IMPRESSION: No acute disease. Age indeterminate fracture left sixth rib. Electronically Signed   By: Inge Rise M.D.   On: 10/16/2020 15:24   DG Shoulder Left  Result Date: 10/17/2020 CLINICAL DATA:  Fall. EXAM: LEFT SHOULDER - 2+ VIEW COMPARISON:  None FINDINGS: There is no evidence of fracture or dislocation. There is no evidence of arthropathy or other focal bone abnormality. Soft tissues are unremarkable. IMPRESSION: Negative. Electronically Signed   By: Kerby Moors M.D.   On: 10/17/2020 10:09   DG Knee Complete 4 Views Left  Result Date: 10/17/2020 CLINICAL DATA:  Un witnessed fall. EXAM: LEFT KNEE - COMPLETE 4+ VIEW COMPARISON:  None. FINDINGS: No evidence of fracture, dislocation, or joint effusion. Moderate medial compartment osteoarthritis. Soft tissues are unremarkable. IMPRESSION: 1. No acute findings. 2. Moderate medial compartment osteoarthritis. Electronically Signed   By: Kerby Moors M.D.   On: 10/17/2020 10:18   DG Knee Complete 4 Views Right  Result Date: 10/17/2020 CLINICAL DATA:  Un  witnessed fall. EXAM: RIGHT KNEE - COMPLETE 4+ VIEW COMPARISON:  None. FINDINGS: No evidence of fracture, dislocation, or joint effusion. Mild tricompartment osteoarthritis. Soft tissues are unremarkable. IMPRESSION: 1. No acute findings. 2. Mild osteoarthritis. Electronically Signed   By: Kerby Moors M.D.   On: 10/17/2020 10:16   CT Renal Stone Study  Result Date: 10/17/2020 CLINICAL DATA:  87 year old male with flank pain and altered mental status. UTI. EXAM: CT ABDOMEN AND PELVIS WITHOUT CONTRAST TECHNIQUE: Multidetector CT imaging of the abdomen and pelvis was performed following the standard protocol without IV contrast. COMPARISON:  None. FINDINGS: Lower  chest: Calcified coronary artery atherosclerosis. No pericardial or pleural effusion. Mild elevation of the left hemidiaphragm. Mild lung base atelectasis. Hepatobiliary: Cholelithiasis in the neck of the gallbladder on series 2, image 30. No pericholecystic inflammation. Negative noncontrast liver. Pancreas: Negative. Spleen: Negative. Adrenals/Urinary Tract: Normal adrenal glands. Difficult to exclude punctate nephrolithiasis in the left upper pole. But no other renal calculus identified. No hydronephrosis. No hydroureter. Evidence of a small left renal midpole cyst. Both ureters remain within normal limits to the bladder. Extensive small layering stones are present throughout the urinary bladder (series 2, image 74, ranging from punctate to 4 mm diameter. Mildly distended urinary bladder. No perivesical stranding. Stomach/Bowel: Normal appendix on series 2, image 49. The cecum does appear to beyond a lax mesentery. No dilated or inflamed bowel is evident. Stomach and duodenum are decompressed. No free air or free fluid. Vascular/Lymphatic: Extensive Aortoiliac calcified atherosclerosis. Normal caliber abdominal aorta. Vascular patency is not evaluated in the absence of IV contrast. No lymphadenopathy. Reproductive: Prostatomegaly. Other: No pelvic free fluid. Musculoskeletal: Advanced lumbar disc and endplate degeneration. Multilevel vacuum disc. No acute osseous abnormality identified. IMPRESSION: 1. Extensive gravel-like stones layering throughout the urinary bladder. But trace if any superimposed nephrolithiasis, and no acute obstructive uropathy. 2. Cholelithiasis without CT evidence of acute cholecystitis. 3. Calcified coronary artery and Aortic Atherosclerosis (ICD10-I70.0). 4. Advanced lumbar spine degeneration. Electronically Signed   By: Genevie Ann M.D.   On: 10/17/2020 11:30   DG Hip Unilat W or Wo Pelvis 2-3 Views Left  Result Date: 10/17/2020 CLINICAL DATA:  Status post fall. EXAM: DG HIP (WITH OR  WITHOUT PELVIS) 2-3V LEFT COMPARISON:  None FINDINGS: There is no evidence of hip fracture or dislocation. Mild left hip osteoarthritis. Degenerative disc disease noted within the visualized portions of the lumbar spine. IMPRESSION: 1. No acute findings. 2. Mild left hip osteoarthritis. Electronically Signed   By: Kerby Moors M.D.   On: 10/17/2020 10:20    Labs on Admission: I have personally reviewed following labs  CBC: Recent Labs  Lab 10/16/20 1324 10/17/20 0821  WBC 9.4 13.5*  NEUTROABS  --  11.4*  HGB 13.5 13.5  HCT 40.0 40.8  MCV 97.3 98.3  PLT 153 573*   Basic Metabolic Panel: Recent Labs  Lab 10/16/20 1324 10/17/20 0821  NA 141 143  K 3.9 3.9  CL 106 103  CO2 25 26  GLUCOSE 122* 128*  BUN 23 31*  CREATININE 1.03 1.07  CALCIUM 9.5 10.0  MG  --  2.1   GFR: Estimated Creatinine Clearance: 33.1 mL/min (by C-G formula based on SCr of 1.07 mg/dL).  Liver Function Tests: Recent Labs  Lab 10/17/20 0821  AST 117*  ALT 37  ALKPHOS 39  BILITOT 1.4*  PROT 6.4*  ALBUMIN 3.9   Cardiac Enzymes: Recent Labs  Lab 10/17/20 0821  CKTOTAL 4,562*   Urine analysis:  Component Value Date/Time   COLORURINE YELLOW (A) 10/17/2020 0821   APPEARANCEUR HAZY (A) 10/17/2020 0821   LABSPEC 1.024 10/17/2020 0821   PHURINE 6.0 10/17/2020 0821   GLUCOSEU NEGATIVE 10/17/2020 0821   HGBUR LARGE (A) 10/17/2020 0821   BILIRUBINUR NEGATIVE 10/17/2020 0821   KETONESUR 20 (A) 10/17/2020 0821   PROTEINUR 30 (A) 10/17/2020 0821   NITRITE POSITIVE (A) 10/17/2020 0821   LEUKOCYTESUR MODERATE (A) 10/17/2020 2411   Dr. Tobie Poet [facility]  If 7PM-7AM, please contact overnight-coverage provider If 7AM-7PM, please contact day coverage provider [email or web address]  10/17/2020, 10:25 PM

## 2020-10-17 NOTE — ED Notes (Signed)
Pt repetively scratching and clawing at himself and staff.  Mittens Placed on hands.

## 2020-10-17 NOTE — Progress Notes (Signed)
ANTICOAGULATION CONSULT NOTE - Initial Consult  Pharmacy Consult for heparin Indication:  elevated troponin and uptrending  No Known Allergies  Patient Measurements: Height: 5\' 5"  (165.1 cm) Weight: 50 kg (110 lb 3.7 oz) IBW/kg (Calculated) : 61.5 Heparin Dosing Weight: 50 kg  Vital Signs: Temp: 98.5 F (36.9 C) (07/30 0729) Temp Source: Oral (07/30 0729) BP: 98/64 (07/30 1251) Pulse Rate: 75 (07/30 1251)  Labs: Recent Labs    10/16/20 1324 10/17/20 0821 10/17/20 1053  HGB 13.5 13.5  --   HCT 40.0 40.8  --   PLT 153 147*  --   CREATININE 1.03 1.07  --   CKTOTAL  --  4,562*  --   TROPONINIHS  --  250* 263*    Estimated Creatinine Clearance: 33.1 mL/min (by C-G formula based on SCr of 1.07 mg/dL).   Medical History: Past Medical History:  Diagnosis Date   Dementia (HCC)    Hypertension     Medications:  (Not in a hospital admission)  Scheduled:   divalproex  125 mg Oral BID   enoxaparin (LOVENOX) injection  40 mg Subcutaneous Q24H   [START ON 10/18/2020] FLUoxetine  20 mg Oral Daily   risperiDONE  1 mg Oral QHS   [START ON 10/18/2020] tamsulosin  0.4 mg Oral Daily   traZODone  100 mg Oral QHS   Infusions:   [START ON 10/18/2020] cefTRIAXone (ROCEPHIN)  IV     PRN: acetaminophen **OR** acetaminophen, haloperidol lactate, LORazepam, ondansetron **OR** ondansetron (ZOFRAN) IV Anti-infectives (From admission, onward)    Start     Dose/Rate Route Frequency Ordered Stop   10/18/20 1000  cefTRIAXone (ROCEPHIN) 2 g in sodium chloride 0.9 % 100 mL IVPB        2 g 200 mL/hr over 30 Minutes Intravenous Every 24 hours 10/17/20 1518 10/20/20 0959   10/17/20 1030  cefTRIAXone (ROCEPHIN) 1 g in sodium chloride 0.9 % 100 mL IVPB        1 g 200 mL/hr over 30 Minutes Intravenous  Once 10/17/20 1017 10/17/20 1148       Assessment: 89YOM presenting after a fall from SNF. Elevated troponins uptrending, 250 >> 263. CBC and platelets stable. No bleeding per notes. Not on  anticoagulation PTA, on VTE Prophylaxis currently.   Goal of Therapy:  Heparin level 0.3-0.7 units/ml Monitor platelets by anticoagulation protocol: Yes   Plan:  Give 3000 units bolus x 1 Start heparin infusion at 600 units/hr Check anti-Xa level in 8 hours and daily while on heparin Continue to monitor H&H and platelets   10/19/20, PharmD Pharmacy Resident  10/17/2020 4:10 PM

## 2020-10-17 NOTE — ED Provider Notes (Signed)
[facility] Emergency Department Provider Note  ____________________________________________   Event Date/Time   First MD Initiated Contact with Patient 10/17/20 0725     (approximate)  I have reviewed the triage vital signs and the nursing notes.   HISTORY  Chief Complaint Fall    HPI Scott Montgomery is a 87 y.o. male with dementia and hypertension who comes in from [facility] for a fall.  Patient is oriented x1.  Per EMS patient was found in the floor this morning between the bed and the dresser.  Unclear how long he had been there for.  Patient noted to have stool and urine on self with abrasions noted mostly on the left side of his body.  Unable to get full HPI from patient due to dementia.  When I discussed with patient he states I am doing pretty good for an old guy.  He denies any pain in his chest, abdomen.  He is able to lift up both legs.  Does report some pain on the left arm.  He is not sure how he fell today.  He states that he just has to use the restroom   On review of records patient was seen yesterday with concern for generalized weakness.  Patient was noted to be agitated which is secondary to his known dementia.  Patient was found to have a normal CT scan and his UA was concerning for mild bacteria which they were given empiric treatment for UTI with Keflex.    Past Medical History:  Diagnosis Date   Dementia (HCC)    Hypertension     There are no problems to display for this patient.   History reviewed. No pertinent surgical history.  Prior to Admission medications   Medication Sig Start Date End Date Taking? Authorizing Provider  cephALEXin (KEFLEX) 500 MG capsule Take 1 capsule (500 mg total) by mouth 4 (four) times daily for 5 days. 10/16/20 10/21/20  Gilles Chiquito, MD    Allergies Patient has no known allergies.  History reviewed. No pertinent family history.  Social History Unable to get full social  history due to patient's dementia   Review of Systems patient denying all of these things but somewhat limited secondary to dementia Constitutional: No fever/chills Eyes: No visual changes. ENT: No sore throat. Cardiovascular: Denies chest pain. Respiratory: Denies shortness of breath. Gastrointestinal: No abdominal pain.  No nausea, no vomiting.  No diarrhea.  No constipation. Genitourinary: Negative for dysuria.  Increased urination Musculoskeletal: Negative for back pain.  Left arm pain Skin: Negative for rash. Neurological: Negative for headaches, focal weakness or numbness. All other ROS negative ____________________________________________   PHYSICAL EXAM:  VITAL SIGNS: ED Triage Vitals  Enc Vitals Group     BP 10/17/20 0729 138/71     Pulse Rate 10/17/20 0729 85     Resp 10/17/20 0729 18     Temp 10/17/20 0729 98.5 F (36.9 C)     Temp Source 10/17/20 0729 Oral     SpO2 10/17/20 0729 96 %     Weight 10/17/20 0730 110 lb 3.7 oz (50 kg)     Height 10/17/20 0730 5\' 5"  (1.651 m)     Head Circumference --      Peak Flow --      Pain Score 10/17/20 0730 7     Pain Loc --      Pain Edu? --      Excl. in GC? --  Constitutional: Alert and oriented x1.  Elderly appearing male Eyes: Conjunctivae are normal. EOMI. Head: Atraumatic. Nose: No congestion/rhinnorhea. Mouth/Throat: Mucous membranes are moist.   Neck: No stridor. Trachea Midline. FROM Cardiovascular: Normal rate, regular rhythm. Grossly normal heart sounds.  Good peripheral circulation.  Denies chest wall tenderness Respiratory: Normal respiratory effort.  No retractions. Lungs CTAB. Gastrointestinal: Soft and nontender. No distention. No abdominal bruits.  Musculoskeletal: No lower extremity tenderness nor edema.  No joint effusions.  Able to lift both legs up off the bed but does have some abrasions noted on his bilateral knees and his left hip.  Also some abrasions noted on his left arm near the forearm.   Patient reporting a little bit of pain in the shoulder, upper arm.  Still able to move the arm though. Neurologic:  Normal speech and language. No gross focal neurologic deficits are appreciated.  Skin:  Skin is warm, dry and intact. No rash noted. Psychiatric: Mood and affect are normal. Speech and behavior are normal. GU: Deferred  Patient was able to pull himself up and I examined his back and there were no abrasions noted on his back and denied any back tenderness. ____________________________________________   LABS (all labs ordered are listed, but only abnormal results are displayed)  Labs Reviewed  CBC WITH DIFFERENTIAL/PLATELET - Abnormal; Notable for the following components:      Result Value   WBC 13.5 (*)    RBC 4.15 (*)    Platelets 147 (*)    Neutro Abs 11.4 (*)    Lymphs Abs 0.5 (*)    Monocytes Absolute 1.6 (*)    All other components within normal limits  COMPREHENSIVE METABOLIC PANEL - Abnormal; Notable for the following components:   Glucose, Bld 128 (*)    BUN 31 (*)    Total Protein 6.4 (*)    AST 117 (*)    Total Bilirubin 1.4 (*)    All other components within normal limits  CK - Abnormal; Notable for the following components:   Total CK 4,562 (*)    All other components within normal limits  URINALYSIS, COMPLETE (UACMP) WITH MICROSCOPIC - Abnormal; Notable for the following components:   Color, Urine YELLOW (*)    APPearance HAZY (*)    Hgb urine dipstick LARGE (*)    Ketones, ur 20 (*)    Protein, ur 30 (*)    Nitrite POSITIVE (*)    Leukocytes,Ua MODERATE (*)    RBC / HPF >50 (*)    Bacteria, UA FEW (*)    All other components within normal limits  TROPONIN I (HIGH SENSITIVITY) - Abnormal; Notable for the following components:   Troponin I (High Sensitivity) 250 (*)    All other components within normal limits  URINE CULTURE  RESP PANEL BY RT-PCR (FLU A&B, COVID) ARPGX2  MAGNESIUM  TROPONIN I (HIGH SENSITIVITY)    ____________________________________________   ED ECG REPORT I, Concha SeMary E Bintou Lafata, the attending physician, personally viewed and interpreted this ECG.  Normal sinus rate of 81, no ST elevations, no T wave versions, does have PVCs, normal intervals ____________________________________________  RADIOLOGY Vela ProseI, Brandan Robicheaux E Twanisha Foulk, personally viewed and evaluated these images (plain radiographs) as part of my medical decision making, as well as reviewing the written report by the radiologist.  ED MD interpretation: No pneumonia or fractures noted  Official radiology report(s): DG Chest 1 View  Result Date: 10/17/2020 CLINICAL DATA:  Un witnessed fall. EXAM: CHEST  1 VIEW COMPARISON:  10/16/20.  FINDINGS: The heart size and mediastinal contours are within normal limits. Both lungs are clear. The visualized skeletal structures are unremarkable. Age-indeterminate left posterior 6 rib fracture is stable from previous exam. IMPRESSION: No active cardiopulmonary abnormalities. Electronically Signed   By: Signa Kell M.D.   On: 10/17/2020 10:08   DG Elbow 2 Views Left  Result Date: 10/17/2020 CLINICAL DATA:  Un witnessed fall. EXAM: LEFT ELBOW - 2 VIEW COMPARISON:  None. FINDINGS: There is no evidence of fracture, dislocation, or joint effusion. There is no evidence of arthropathy or other focal bone abnormality. Soft tissues are unremarkable. IMPRESSION: Negative. Electronically Signed   By: Signa Kell M.D.   On: 10/17/2020 10:15   CT Head Wo Contrast  Result Date: 10/17/2020 CLINICAL DATA:  87 year old male found down. EXAM: CT HEAD WITHOUT CONTRAST TECHNIQUE: Contiguous axial images were obtained from the base of the skull through the vertex without intravenous contrast. COMPARISON:  Head and cervical spine CT yesterday. FINDINGS: Brain: Stable cerebral volume, with disproportionate mesial temporal lobe atrophy. Stable ventricle size and configuration. No superimposed No midline shift, mass effect,  evidence of mass lesion, intracranial hemorrhage or evidence of cortically based acute infarction. Stable gray-white matter differentiation throughout the brain. Vascular: Calcified atherosclerosis at the skull base. No suspicious intracranial vascular hyperdensity. Skull: Stable and intact. Sinuses/Orbits: Mild to moderate ethmoid and maxillary sinus mucosal thickening is stable. Tympanic cavities and mastoids remain clear. Other: Broad-based left posterior convexity scalp hematoma is new on series 4, image 61. Underlying calvarium appears stable and intact. Other orbit and scalp soft tissues appear stable. IMPRESSION: 1. Left posterior convexity scalp hematoma without underlying skull fracture. 2. No acute intracranial abnormality, stable brain since yesterday. Electronically Signed   By: Odessa Fleming M.D.   On: 10/17/2020 09:17   CT Head Wo Contrast  Result Date: 10/16/2020 CLINICAL DATA:  Head trauma, minor (Age >= 65y) fall EXAM: CT HEAD WITHOUT CONTRAST TECHNIQUE: Contiguous axial images were obtained from the base of the skull through the vertex without intravenous contrast. COMPARISON:  None. FINDINGS: Brain: There is atrophy and chronic small vessel disease changes. No acute intracranial abnormality. Specifically, no hemorrhage, hydrocephalus, mass lesion, acute infarction, or significant intracranial injury. Vascular: No hyperdense vessel or unexpected calcification. Skull: No acute calvarial abnormality. Sinuses/Orbits: No acute findings Other: None IMPRESSION: Atrophy, chronic microvascular disease. No acute intracranial abnormality. Electronically Signed   By: Charlett Nose M.D.   On: 10/16/2020 16:42   CT Cervical Spine Wo Contrast  Result Date: 10/17/2020 CLINICAL DATA:  87 year old male found down. EXAM: CT CERVICAL SPINE WITHOUT CONTRAST TECHNIQUE: Multidetector CT imaging of the cervical spine was performed without intravenous contrast. Multiplanar CT image reconstructions were also generated.  COMPARISON:  CT head today.  CT head and cervical spine yesterday. FINDINGS: Alignment: Stable straightening of cervical lordosis. Cervicothoracic junction alignment is within normal limits. Bilateral posterior element alignment is within normal limits. Skull base and vertebrae: Central skull base appears stable and intact. C1 and C2 appear stable and intact. Stable visualized osseous structures. Soft tissues and spinal canal: No prevertebral fluid or swelling. No visible canal hematoma. Small volume retained secretions in the hypopharynx. Otherwise negative noncontrast visible neck soft tissues. Disc levels: Bulky degenerative ligamentous hypertrophy about the odontoid with mild cervicomedullary junction spinal stenosis. Advanced cervical spine degeneration elsewhere, including bulky disc degeneration at C4-C5 with suspected mild to moderate spinal stenosis there, at C5-C6. Upper chest: Visible upper thoracic levels and lung apices are stable. IMPRESSION: 1. No  acute traumatic injury identified in the cervical spine. 2. Stable advanced cervical spine degeneration with suspected degenerative spinal stenosis at the cervicomedullary junction, C4-C5, and C5-C6. Electronically Signed   By: Odessa Fleming M.D.   On: 10/17/2020 09:20   CT Cervical Spine Wo Contrast  Result Date: 10/16/2020 CLINICAL DATA:  Neck trauma (Age >= 65y).  Fall. EXAM: CT CERVICAL SPINE WITHOUT CONTRAST TECHNIQUE: Multidetector CT imaging of the cervical spine was performed without intravenous contrast. Multiplanar CT image reconstructions were also generated. COMPARISON:  None. FINDINGS: Alignment: No subluxation Skull base and vertebrae: No acute fracture. No primary bone lesion or focal pathologic process. Soft tissues and spinal canal: No prevertebral fluid or swelling. No visible canal hematoma. Disc levels: Diffuse degenerative disc disease, most pronounced from C5-6 through C7-T1. Moderate to advanced bilateral degenerative facet disease  diffusely, left greater than right. Upper chest: No acute findings.  Biapical scarring. Other: None IMPRESSION: Degenerative disc and facet disease.  No acute bony abnormality. Electronically Signed   By: Charlett Nose M.D.   On: 10/16/2020 16:43   DG Chest Portable 1 View  Result Date: 10/16/2020 CLINICAL DATA:  Onset generalized weakness after lunch today. EXAM: PORTABLE CHEST 1 VIEW COMPARISON:  None. FINDINGS: There is mild elevation of the left hemidiaphragm relative to the right with some left basilar atelectasis. Lungs are otherwise clear. No pneumothorax or pleural fluid. Heart size is normal. The patient has a fracture of the posterior arc of the left sixth rib which is age indeterminate. IMPRESSION: No acute disease. Age indeterminate fracture left sixth rib. Electronically Signed   By: Drusilla Kanner M.D.   On: 10/16/2020 15:24   DG Shoulder Left  Result Date: 10/17/2020 CLINICAL DATA:  Fall. EXAM: LEFT SHOULDER - 2+ VIEW COMPARISON:  None FINDINGS: There is no evidence of fracture or dislocation. There is no evidence of arthropathy or other focal bone abnormality. Soft tissues are unremarkable. IMPRESSION: Negative. Electronically Signed   By: Signa Kell M.D.   On: 10/17/2020 10:09   DG Knee Complete 4 Views Left  Result Date: 10/17/2020 CLINICAL DATA:  Un witnessed fall. EXAM: LEFT KNEE - COMPLETE 4+ VIEW COMPARISON:  None. FINDINGS: No evidence of fracture, dislocation, or joint effusion. Moderate medial compartment osteoarthritis. Soft tissues are unremarkable. IMPRESSION: 1. No acute findings. 2. Moderate medial compartment osteoarthritis. Electronically Signed   By: Signa Kell M.D.   On: 10/17/2020 10:18   DG Knee Complete 4 Views Right  Result Date: 10/17/2020 CLINICAL DATA:  Un witnessed fall. EXAM: RIGHT KNEE - COMPLETE 4+ VIEW COMPARISON:  None. FINDINGS: No evidence of fracture, dislocation, or joint effusion. Mild tricompartment osteoarthritis. Soft tissues are  unremarkable. IMPRESSION: 1. No acute findings. 2. Mild osteoarthritis. Electronically Signed   By: Signa Kell M.D.   On: 10/17/2020 10:16   DG Hip Unilat W or Wo Pelvis 2-3 Views Left  Result Date: 10/17/2020 CLINICAL DATA:  Status post fall. EXAM: DG HIP (WITH OR WITHOUT PELVIS) 2-3V LEFT COMPARISON:  None FINDINGS: There is no evidence of hip fracture or dislocation. Mild left hip osteoarthritis. Degenerative disc disease noted within the visualized portions of the lumbar spine. IMPRESSION: 1. No acute findings. 2. Mild left hip osteoarthritis. Electronically Signed   By: Signa Kell M.D.   On: 10/17/2020 10:20    ____________________________________________   PROCEDURES  Procedure(s) performed (including Critical Care):  .1-3 Lead EKG Interpretation  Date/Time: 10/17/2020 11:24 AM Performed by: Concha Se, MD Authorized by: Concha Se,  MD     Interpretation: normal     ECG rate:  80s   ECG rate assessment: normal     Rhythm: sinus rhythm     Ectopy: PVCs     Conduction: normal     ____________________________________________   INITIAL IMPRESSION / ASSESSMENT AND PLAN / ED COURSE  Scott Montgomery was evaluated in Emergency Department on 10/17/2020 for the symptoms described in the history of present illness. He was evaluated in the context of the global COVID-19 pandemic, which necessitated consideration that the patient might be at risk for infection with the SARS-CoV-2 virus that causes COVID-19. Institutional protocols and algorithms that pertain to the evaluation of patients at risk for COVID-19 are in a state of rapid change based on information released by regulatory bodies including the CDC and federal and state organizations. These policies and algorithms were followed during the patient's care in the ED.    Patient comes in with most likely mechanical fall secondary to dementia found down on the ground for unclear amount of time.  Will get labs to  evaluate for electrolyte abnormalities, AKI, rhabdo, worsening UTI.  Will get x-rays to evaluate for fractures in the areas with significant bruising and/or pain.  Will get imaging of his head to evaluate for intercranial hemorrhage and CT cervical spine given patient's dementia.  No significant chest wall tenderness or abdominal tenderness to suggest significant injuries in these areas.  CT and xrays are negative  Urine consistent with UTI will give dose of ceftiaxone.  Given the blood in the urine we will get a CT scan to make sure evidence of kidney stone given patient dementia unable to know if he has any back pain.  Trop elevated could be demand-will continue to trend them   Given patient failed outpatient management of UTI with his weakness and now has a fall with evidence of mild rhabdo and troponin elevation will discuss with family for admission.  D/w POA wife and [facility] who request that we attempt to transfer to the [place].  They have no beds okay at the admitting here  CT without evidence of obstructive kidney stones.  Has a lot of kidney stones in his bladder.  No need for emergent urology consult.   [facility]   [facility] will [facility] with our [facility] team for admission.  Patient did require little bit of the Haldol and Ativan due to some agitation         ____________________________________________   FINAL CLINICAL IMPRESSION(S) / ED DIAGNOSES   Final diagnoses:  Weakness  Elevated troponin  Acute cystitis without hematuria      MEDICATIONS GIVEN DURING THIS VISIT:  Medications  acetaminophen (TYLENOL) tablet 1,000 mg (1,000 mg Oral Patient Refused/Not Given 10/17/20 1045)  cefTRIAXone (ROCEPHIN) 1 g in sodium chloride 0.9 % 100 mL IVPB (0 g Intravenous Stopped 10/17/20 1148)  sodium chloride 0.9 % bolus 1,000 mL (0 mLs Intravenous Stopped 10/17/20 1148)  haloperidol lactate (HALDOL) injection 2 mg (2 mg Intravenous Given  10/17/20 1042)  LORazepam (ATIVAN) injection 1 mg (1 mg Intravenous Given 10/17/20 1345)     ED Discharge Orders     None        Note:  This document was prepared using Dragon voice recognition software and may include unintentional dictation errors.    Concha Se, MD 10/17/20 [phone]

## 2020-10-17 NOTE — ED Triage Notes (Signed)
Patient brought in via ems from health care facility. Patient was found on floor this morning between bed and dresser. Per ems facility does not know how long he had been on floor. Patient has urine and stool all over self with abrasions noted to left  side of body

## 2020-10-17 NOTE — ED Notes (Signed)
[place] RN aware of assigned bed 

## 2020-10-18 DIAGNOSIS — Z20822 Contact with and (suspected) exposure to covid-19: Secondary | ICD-10-CM | POA: Diagnosis present

## 2020-10-18 DIAGNOSIS — Z79899 Other long term (current) drug therapy: Secondary | ICD-10-CM | POA: Diagnosis not present

## 2020-10-18 DIAGNOSIS — G4700 Insomnia, unspecified: Secondary | ICD-10-CM | POA: Diagnosis present

## 2020-10-18 DIAGNOSIS — Z66 Do not resuscitate: Secondary | ICD-10-CM | POA: Diagnosis present

## 2020-10-18 DIAGNOSIS — F0390 Unspecified dementia without behavioral disturbance: Secondary | ICD-10-CM | POA: Diagnosis not present

## 2020-10-18 DIAGNOSIS — R296 Repeated falls: Secondary | ICD-10-CM | POA: Diagnosis not present

## 2020-10-18 DIAGNOSIS — Z681 Body mass index (BMI) 19 or less, adult: Secondary | ICD-10-CM | POA: Diagnosis not present

## 2020-10-18 DIAGNOSIS — F411 Generalized anxiety disorder: Secondary | ICD-10-CM | POA: Diagnosis present

## 2020-10-18 DIAGNOSIS — W1830XA Fall on same level, unspecified, initial encounter: Secondary | ICD-10-CM | POA: Diagnosis present

## 2020-10-18 DIAGNOSIS — F0391 Unspecified dementia with behavioral disturbance: Secondary | ICD-10-CM | POA: Diagnosis present

## 2020-10-18 DIAGNOSIS — E43 Unspecified severe protein-calorie malnutrition: Secondary | ICD-10-CM | POA: Diagnosis present

## 2020-10-18 DIAGNOSIS — R8271 Bacteriuria: Secondary | ICD-10-CM | POA: Diagnosis present

## 2020-10-18 DIAGNOSIS — R627 Adult failure to thrive: Secondary | ICD-10-CM | POA: Diagnosis not present

## 2020-10-18 DIAGNOSIS — J9601 Acute respiratory failure with hypoxia: Secondary | ICD-10-CM | POA: Diagnosis not present

## 2020-10-18 DIAGNOSIS — Z515 Encounter for palliative care: Secondary | ICD-10-CM | POA: Diagnosis not present

## 2020-10-18 DIAGNOSIS — Z23 Encounter for immunization: Secondary | ICD-10-CM | POA: Diagnosis not present

## 2020-10-18 DIAGNOSIS — W19XXXS Unspecified fall, sequela: Secondary | ICD-10-CM | POA: Diagnosis not present

## 2020-10-18 DIAGNOSIS — S0081XA Abrasion of other part of head, initial encounter: Secondary | ICD-10-CM | POA: Diagnosis not present

## 2020-10-18 DIAGNOSIS — Z9119 Patient's noncompliance with other medical treatment and regimen: Secondary | ICD-10-CM | POA: Diagnosis not present

## 2020-10-18 DIAGNOSIS — Z7189 Other specified counseling: Secondary | ICD-10-CM | POA: Diagnosis not present

## 2020-10-18 DIAGNOSIS — G9341 Metabolic encephalopathy: Secondary | ICD-10-CM | POA: Diagnosis present

## 2020-10-18 DIAGNOSIS — R410 Disorientation, unspecified: Secondary | ICD-10-CM | POA: Diagnosis not present

## 2020-10-18 DIAGNOSIS — I10 Essential (primary) hypertension: Secondary | ICD-10-CM | POA: Diagnosis present

## 2020-10-18 DIAGNOSIS — W19XXXA Unspecified fall, initial encounter: Secondary | ICD-10-CM | POA: Diagnosis not present

## 2020-10-18 DIAGNOSIS — E785 Hyperlipidemia, unspecified: Secondary | ICD-10-CM | POA: Diagnosis present

## 2020-10-18 DIAGNOSIS — R1319 Other dysphagia: Secondary | ICD-10-CM | POA: Diagnosis not present

## 2020-10-18 DIAGNOSIS — L89896 Pressure-induced deep tissue damage of other site: Secondary | ICD-10-CM | POA: Diagnosis present

## 2020-10-18 DIAGNOSIS — Z5320 Procedure and treatment not carried out because of patient's decision for unspecified reasons: Secondary | ICD-10-CM | POA: Diagnosis not present

## 2020-10-18 DIAGNOSIS — N210 Calculus in bladder: Secondary | ICD-10-CM | POA: Diagnosis present

## 2020-10-18 DIAGNOSIS — F32A Depression, unspecified: Secondary | ICD-10-CM | POA: Diagnosis present

## 2020-10-18 DIAGNOSIS — R569 Unspecified convulsions: Secondary | ICD-10-CM | POA: Diagnosis present

## 2020-10-18 DIAGNOSIS — R1310 Dysphagia, unspecified: Secondary | ICD-10-CM | POA: Diagnosis not present

## 2020-10-18 DIAGNOSIS — Y92099 Unspecified place in other non-institutional residence as the place of occurrence of the external cause: Secondary | ICD-10-CM | POA: Diagnosis not present

## 2020-10-18 DIAGNOSIS — N400 Enlarged prostate without lower urinary tract symptoms: Secondary | ICD-10-CM | POA: Diagnosis present

## 2020-10-18 DIAGNOSIS — E538 Deficiency of other specified B group vitamins: Secondary | ICD-10-CM | POA: Diagnosis present

## 2020-10-18 DIAGNOSIS — J690 Pneumonitis due to inhalation of food and vomit: Secondary | ICD-10-CM | POA: Diagnosis present

## 2020-10-18 DIAGNOSIS — R778 Other specified abnormalities of plasma proteins: Secondary | ICD-10-CM | POA: Diagnosis present

## 2020-10-18 DIAGNOSIS — M6282 Rhabdomyolysis: Secondary | ICD-10-CM | POA: Diagnosis present

## 2020-10-18 DIAGNOSIS — Z8719 Personal history of other diseases of the digestive system: Secondary | ICD-10-CM | POA: Diagnosis not present

## 2020-10-18 DIAGNOSIS — S0003XA Contusion of scalp, initial encounter: Secondary | ICD-10-CM | POA: Diagnosis present

## 2020-10-18 LAB — CBC
HCT: 37.5 % — ABNORMAL LOW (ref 39.0–52.0)
Hemoglobin: 12.6 g/dL — ABNORMAL LOW (ref 13.0–17.0)
MCH: 33.8 pg (ref 26.0–34.0)
MCHC: 33.6 g/dL (ref 30.0–36.0)
MCV: 100.5 fL — ABNORMAL HIGH (ref 80.0–100.0)
Platelets: 127 10*3/uL — ABNORMAL LOW (ref 150–400)
RBC: 3.73 MIL/uL — ABNORMAL LOW (ref 4.22–5.81)
RDW: 12.5 % (ref 11.5–15.5)
WBC: 9 10*3/uL (ref 4.0–10.5)
nRBC: 0 % (ref 0.0–0.2)

## 2020-10-18 LAB — BASIC METABOLIC PANEL
Anion gap: 6 (ref 5–15)
BUN: 37 mg/dL — ABNORMAL HIGH (ref 8–23)
CO2: 30 mmol/L (ref 22–32)
Calcium: 8.9 mg/dL (ref 8.9–10.3)
Chloride: 106 mmol/L (ref 98–111)
Creatinine, Ser: 0.9 mg/dL (ref 0.61–1.24)
GFR, Estimated: >60 mL/min (ref >60)
Glucose, Bld: 76 mg/dL (ref 70–99)
Potassium: 3.9 mmol/L (ref 3.5–5.1)
Sodium: 142 mmol/L (ref 135–145)

## 2020-10-18 LAB — URINE CULTURE

## 2020-10-18 LAB — HEPARIN LEVEL (UNFRACTIONATED): Heparin Unfractionated: <0.1 IU/mL — ABNORMAL LOW (ref 0.30–0.70)

## 2020-10-18 MED ORDER — HYDROCERIN EX CREA
TOPICAL_CREAM | Freq: Two times a day (BID) | CUTANEOUS | Status: DC
  Filled 2020-10-18: qty 113

## 2020-10-18 MED ORDER — SODIUM CHLORIDE 0.9 % IV SOLN
1.0000 g | INTRAVENOUS | Status: AC
  Administered 2020-10-18 – 2020-10-19 (×2): 1 g via INTRAVENOUS
  Filled 2020-10-18: qty 10
  Filled 2020-10-18: qty 1

## 2020-10-18 NOTE — Progress Notes (Signed)
PROGRESS NOTE    Scott Montgomery  BJY:[id number] DOB: 1930/02/25 DOA: 10/17/2020 PCP: [facility]   Brief Narrative:  87 y.o. male with medical history significant for hyperlipidemia, BPH, hypertension, cataracts, history of colon polyps, dementia, erectile dysfunction, generalized anxiety disorder, osteoarthritis, pseudophakia, presents to the emergency department from facility for chief concerns of altered mentation.   At bedside patient is able to tell me his name with difficulty.  He was able to tell me his age.  He does attempt to take out his lines and IV.  Bilateral mittens are in place.  Apparently patient had a fall at his assisted living that was unwitnessed.  Unknown time down.  Facility staff noted him to be down and brought him to the ED.  He had evidence of mild rhabdomyolysis, no urinary tract infection, encephalopathy and was admitted to the hospitalist service.  The following morning I spoke at length to his wife and relative at bedside.  Per the family the patient is agitated and unaware of surroundings at baseline.  They were inquiring about post discharge transfer to higher level of care.   Assessment & Plan:   Principal Problem:   Confusion Active Problems:   Fall   Dementia [facility])   Essential hypertension   Elevated troponin   Acute lower UTI   History of seizure   BPH (benign prostatic hyperplasia)   Dementia with behavioral disturbance (HCC)  Acute metabolic encephalopathy Suspect secondary to underlying dementia in the setting of urinary tract infection Patient was previously treated with p.o. Keflex on 7/29, adherence unclear Plan: Unable to determine true presence of urinary tract infection given lack of fever or leukocytosis however with the confusion will elect to treat as active infection.  Continue Rocephin 1 g daily Monitor vitals and fever curve Monitor cultures for pathogen identification  Found down Unknown time down,  moving all extremities CT head on admission with left posterior convexity scalp hematoma No skull fracture, no intracranial abnormality Plan: Fall precautions Delirium precautions  Dementia with agitation Per the family at bedside the patient is agitated at baseline Plan: Continue as needed Haldol Low-dose benzodiazepines as needed If patient is to remain admitted we will consult palliative care  # Hyperlipidemia on simvastatin 40 mg previously,   # History of seizure - On Depakote 125 mg p.o. twice daily, seizure precaution  # B12 deficiency   # BPH-finasteride 5 mg daily, tamsulosin 0.5 mg daily terazosin 10 mg daily, # Depression/anxiety-fluoxetine 20 mg daily resumed # Insomnia-trazodone 50 mg daily at bedtime   DVT prophylaxis: TED hose Code Status: DNR, discussed and confirmed with family at bedside Family Communication: Wife and relative at bedside.  Wife is MPOA Disposition Plan: Status is: Observation  The patient will require care spanning > 2 midnights and should be moved to inpatient because: Inpatient level of care appropriate due to severity of illness  Dispo: The patient is from: ALF              Anticipated d/c is to: ALF              Patient currently is not medically stable to d/c.   Difficult to place patient No       Level of care: Med-Surg  Consultants:  None  Procedures:  None  Antimicrobials:  Ceftriaxone   Subjective: Seen and examined.  Sedated.  History obtained from family at bedside.  Objective: Vitals:   10/17/20 1946 10/18/20 0402 10/18/20 1018 10/18/20 1047  BP: 126/83 134/72 (!) 181/68 (!) 154/83  Pulse: (!) 51 (!) 54 (!) 51 (!) 52  Resp: Temp: 97.6 F (36.4 C) 98 F (36.7 C) (!) 97.5 F (36.4 C)   TempSrc:      SpO2: 100% 100% 100%   Weight:      Height:        Intake/Output Summary (Last 24 hours) at 10/18/2020 1325 Last data filed at 10/18/2020 1026 Gross per 24 hour  Intake 0 ml  Output 200 ml   Net -200 ml   Filed Weights   10/17/20 0730  Weight: 50 kg    Examination:  General exam: No acute distress.  Lethargic and sedated.  Appears frail Respiratory system: Lungs overall clear.  Normal work of breathing.  Room air Cardiovascular system: S1-S2, regular rhythm, no murmurs  gastrointestinal system: Thin/scaphoid, nontender, nondistended, normal bowel sounds Central nervous system: Awake, oriented x0, lethargic Extremities: Unable to assess power Skin: Thin and pale with scattered excoriations Psychiatry: Judgement and insight appear impaired. Mood & affect flattened.     Data Reviewed: I have personally reviewed following labs and imaging studies  CBC: Recent Labs  Lab 10/16/20 1324 10/17/20 0821 10/17/20 2226 10/18/20 0555  WBC 9.4 13.5* 10.8* 9.0  NEUTROABS  --  11.4*  --   --   HGB 13.5 13.5 12.7* 12.6*  HCT 40.0 40.8 38.2* 37.5*  MCV 97.3 98.3 100.3* 100.5*  PLT 153 147* 131* 127*   Basic Metabolic Panel: Recent Labs  Lab 10/16/20 1324 10/17/20 0821 10/18/20 0555  NA 141 143 142  K 3.9 3.9 3.9  CL 106 103 106  CO2 GLUCOSE 122* 128* 76  BUN 23 31* 37*  CREATININE 1.03 1.07 0.90  CALCIUM 9.5 10.0 8.9  MG  --  2.1  --    GFR: Estimated Creatinine Clearance: 39.4 mL/min (by C-G formula based on SCr of 0.9 mg/dL). Liver Function Tests: Recent Labs  Lab 10/17/20 0821  AST 117*  ALT 37  ALKPHOS 39  BILITOT 1.4*  PROT 6.4*  ALBUMIN 3.9   No results for input(s): LIPASE, AMYLASE in the last 168 hours. No results for input(s): AMMONIA in the last 168 hours. Coagulation Profile: Recent Labs  Lab 10/17/20 1810  INR 1.0   Cardiac Enzymes: Recent Labs  Lab 10/17/20 0821  CKTOTAL 4,562*   BNP (last 3 results) No results for input(s): PROBNP in the last 8760 hours. HbA1C: No results for input(s): HGBA1C in the last 72 hours. CBG: No results for input(s): GLUCAP in the last 168 hours. Lipid Profile: No results for input(s):  CHOL, HDL, LDLCALC, TRIG, CHOLHDL, LDLDIRECT in the last 72 hours. Thyroid Function Tests: Recent Labs    10/17/20 1553  TSH 1.498   Anemia Panel: No results for input(s): VITAMINB12, FOLATE, FERRITIN, TIBC, IRON, RETICCTPCT in the last 72 hours. Sepsis Labs: No results for input(s): PROCALCITON, LATICACIDVEN in the last 168 hours.  Recent Results (from the past 240 hour(s))  Urine Culture     Status: None (Preliminary result)   Collection Time: 10/16/20  3:49 PM   Specimen: Urine, Random  Result Value Ref Range Status   Specimen Description   Final    URINE, RANDOM Performed at [facility], [address], [place], [place] [address]    Special Requests   Final    NONE Performed at [facility], [address], [place], [place] [address]  Culture   Final    CULTURE REINCUBATED FOR BETTER GROWTH Performed at [facility], [address] [place], [place] [address]    Report Status PENDING  Incomplete  Resp Panel by RT-PCR (Flu A&B, Covid) Nasopharyngeal Swab     Status: None   Collection Time: 10/17/20 10:53 AM   Specimen: Nasopharyngeal Swab; Nasopharyngeal(NP) swabs in vial transport medium  Result Value Ref Range Status   SARS Coronavirus 2 by RT PCR NEGATIVE NEGATIVE Final    Comment: (NOTE) SARS-CoV-2 target nucleic acids are NOT DETECTED.  The SARS-CoV-2 RNA is generally detectable in upper respiratory specimens during the acute phase of infection. The lowest concentration of SARS-CoV-2 viral copies this assay can detect is 138 copies/mL. A negative result does not preclude SARS-Cov-2 infection and should not be used as the sole basis for treatment or other patient management decisions. A negative result may occur with  improper specimen collection/handling, submission of specimen other than nasopharyngeal swab, presence of viral mutation(s) within the areas targeted by this assay, and inadequate number of viral copies(<138 copies/mL).  A negative result must be combined with clinical observations, patient history, and epidemiological information. The expected result is Negative.  Fact Sheet for Patients:  BloggerCourse.comhttps://www.fda.gov/media/152166/download  Fact Sheet for Healthcare Providers:  SeriousBroker.ithttps://www.fda.gov/media/152162/download  This test is no t yet approved or cleared by the [place] FDA and  has been authorized for detection and/or diagnosis of SARS-CoV-2 by FDA under an Emergency Use Authorization (EUA). This EUA will remain  in effect (meaning this test can be used) for the duration of the COVID-19 declaration under Section 564(b)(1) of the Act, 21 U.S.C.section 360bbb-3(b)(1), unless the authorization is terminated  or revoked sooner.       Influenza A by PCR NEGATIVE NEGATIVE Final   Influenza B by PCR NEGATIVE NEGATIVE Final    Comment: (NOTE) The Xpert Xpress SARS-CoV-2/FLU/RSV plus assay is intended as an aid in the diagnosis of influenza from Nasopharyngeal swab specimens and should not be used as a sole basis for treatment. Nasal washings and aspirates are unacceptable for Xpert Xpress SARS-CoV-2/FLU/RSV testing.  Fact Sheet for Patients: BloggerCourse.comhttps://www.fda.gov/media/152166/download  Fact Sheet for Healthcare Providers: SeriousBroker.ithttps://www.fda.gov/media/152162/download  This test is not yet approved or cleared by the [place] FDA and has been authorized for detection and/or diagnosis of SARS-CoV-2 by FDA under an Emergency Use Authorization (EUA). This EUA will remain in effect (meaning this test can be used) for the duration of the COVID-19 declaration under Section 564(b)(1) of the Act, 21 U.S.C. section 360bbb-3(b)(1), unless the authorization is terminated or revoked.  Performed at [facility], [address] [place], [place] [address]          Radiology Studies: DG Chest 1 View  Result Date: 10/17/2020 CLINICAL DATA:  Un witnessed fall. EXAM: CHEST  1 VIEW COMPARISON:   10/16/20. FINDINGS: The heart size and mediastinal contours are within normal limits. Both lungs are clear. The visualized skeletal structures are unremarkable. Age-indeterminate left posterior 6 rib fracture is stable from previous exam. IMPRESSION: No active cardiopulmonary abnormalities. Electronically Signed   By: Signa Kellaylor  Stroud M.D.   On: 10/17/2020 10:08   DG Elbow 2 Views Left  Result Date: 10/17/2020 CLINICAL DATA:  Un witnessed fall. EXAM: LEFT ELBOW - 2 VIEW COMPARISON:  None. FINDINGS: There is no evidence of fracture, dislocation, or joint effusion. There is no evidence of arthropathy or other focal bone abnormality. Soft tissues are unremarkable. IMPRESSION: Negative. Electronically Signed   By:  Signa Kell M.D.   On: 10/17/2020 10:15   CT Head Wo Contrast  Result Date: 10/17/2020 CLINICAL DATA:  87 year old male found down. EXAM: CT HEAD WITHOUT CONTRAST TECHNIQUE: Contiguous axial images were obtained from the base of the skull through the vertex without intravenous contrast. COMPARISON:  Head and cervical spine CT yesterday. FINDINGS: Brain: Stable cerebral volume, with disproportionate mesial temporal lobe atrophy. Stable ventricle size and configuration. No superimposed No midline shift, mass effect, evidence of mass lesion, intracranial hemorrhage or evidence of cortically based acute infarction. Stable gray-white matter differentiation throughout the brain. Vascular: Calcified atherosclerosis at the skull base. No suspicious intracranial vascular hyperdensity. Skull: Stable and intact. Sinuses/Orbits: Mild to moderate ethmoid and maxillary sinus mucosal thickening is stable. Tympanic cavities and mastoids remain clear. Other: Broad-based left posterior convexity scalp hematoma is new on series 4, image 61. Underlying calvarium appears stable and intact. Other orbit and scalp soft tissues appear stable. IMPRESSION: 1. Left posterior convexity scalp hematoma without underlying skull  fracture. 2. No acute intracranial abnormality, stable brain since yesterday. Electronically Signed   By: Odessa Fleming M.D.   On: 10/17/2020 09:17   CT Head Wo Contrast  Result Date: 10/16/2020 CLINICAL DATA:  Head trauma, minor (Age >= 65y) fall EXAM: CT HEAD WITHOUT CONTRAST TECHNIQUE: Contiguous axial images were obtained from the base of the skull through the vertex without intravenous contrast. COMPARISON:  None. FINDINGS: Brain: There is atrophy and chronic small vessel disease changes. No acute intracranial abnormality. Specifically, no hemorrhage, hydrocephalus, mass lesion, acute infarction, or significant intracranial injury. Vascular: No hyperdense vessel or unexpected calcification. Skull: No acute calvarial abnormality. Sinuses/Orbits: No acute findings Other: None IMPRESSION: Atrophy, chronic microvascular disease. No acute intracranial abnormality. Electronically Signed   By: Charlett Nose M.D.   On: 10/16/2020 16:42   CT Cervical Spine Wo Contrast  Result Date: 10/17/2020 CLINICAL DATA:  87 year old male found down. EXAM: CT CERVICAL SPINE WITHOUT CONTRAST TECHNIQUE: Multidetector CT imaging of the cervical spine was performed without intravenous contrast. Multiplanar CT image reconstructions were also generated. COMPARISON:  CT head today.  CT head and cervical spine yesterday. FINDINGS: Alignment: Stable straightening of cervical lordosis. Cervicothoracic junction alignment is within normal limits. Bilateral posterior element alignment is within normal limits. Skull base and vertebrae: Central skull base appears stable and intact. C1 and C2 appear stable and intact. Stable visualized osseous structures. Soft tissues and spinal canal: No prevertebral fluid or swelling. No visible canal hematoma. Small volume retained secretions in the hypopharynx. Otherwise negative noncontrast visible neck soft tissues. Disc levels: Bulky degenerative ligamentous hypertrophy about the odontoid with mild  cervicomedullary junction spinal stenosis. Advanced cervical spine degeneration elsewhere, including bulky disc degeneration at C4-C5 with suspected mild to moderate spinal stenosis there, at C5-C6. Upper chest: Visible upper thoracic levels and lung apices are stable. IMPRESSION: 1. No acute traumatic injury identified in the cervical spine. 2. Stable advanced cervical spine degeneration with suspected degenerative spinal stenosis at the cervicomedullary junction, C4-C5, and C5-C6. Electronically Signed   By: Odessa Fleming M.D.   On: 10/17/2020 09:20   CT Cervical Spine Wo Contrast  Result Date: 10/16/2020 CLINICAL DATA:  Neck trauma (Age >= 65y).  Fall. EXAM: CT CERVICAL SPINE WITHOUT CONTRAST TECHNIQUE: Multidetector CT imaging of the cervical spine was performed without intravenous contrast. Multiplanar CT image reconstructions were also generated. COMPARISON:  None. FINDINGS: Alignment: No subluxation Skull base and vertebrae: No acute fracture. No primary bone lesion or focal pathologic process. Soft  tissues and spinal canal: No prevertebral fluid or swelling. No visible canal hematoma. Disc levels: Diffuse degenerative disc disease, most pronounced from C5-6 through C7-T1. Moderate to advanced bilateral degenerative facet disease diffusely, left greater than right. Upper chest: No acute findings.  Biapical scarring. Other: None IMPRESSION: Degenerative disc and facet disease.  No acute bony abnormality. Electronically Signed   By: Charlett Nose M.D.   On: 10/16/2020 16:43   DG Chest Portable 1 View  Result Date: 10/16/2020 CLINICAL DATA:  Onset generalized weakness after lunch today. EXAM: PORTABLE CHEST 1 VIEW COMPARISON:  None. FINDINGS: There is mild elevation of the left hemidiaphragm relative to the right with some left basilar atelectasis. Lungs are otherwise clear. No pneumothorax or pleural fluid. Heart size is normal. The patient has a fracture of the posterior arc of the left sixth rib which is age  indeterminate. IMPRESSION: No acute disease. Age indeterminate fracture left sixth rib. Electronically Signed   By: Drusilla Kanner M.D.   On: 10/16/2020 15:24   DG Shoulder Left  Result Date: 10/17/2020 CLINICAL DATA:  Fall. EXAM: LEFT SHOULDER - 2+ VIEW COMPARISON:  None FINDINGS: There is no evidence of fracture or dislocation. There is no evidence of arthropathy or other focal bone abnormality. Soft tissues are unremarkable. IMPRESSION: Negative. Electronically Signed   By: Signa Kell M.D.   On: 10/17/2020 10:09   DG Knee Complete 4 Views Left  Result Date: 10/17/2020 CLINICAL DATA:  Un witnessed fall. EXAM: LEFT KNEE - COMPLETE 4+ VIEW COMPARISON:  None. FINDINGS: No evidence of fracture, dislocation, or joint effusion. Moderate medial compartment osteoarthritis. Soft tissues are unremarkable. IMPRESSION: 1. No acute findings. 2. Moderate medial compartment osteoarthritis. Electronically Signed   By: Signa Kell M.D.   On: 10/17/2020 10:18   DG Knee Complete 4 Views Right  Result Date: 10/17/2020 CLINICAL DATA:  Un witnessed fall. EXAM: RIGHT KNEE - COMPLETE 4+ VIEW COMPARISON:  None. FINDINGS: No evidence of fracture, dislocation, or joint effusion. Mild tricompartment osteoarthritis. Soft tissues are unremarkable. IMPRESSION: 1. No acute findings. 2. Mild osteoarthritis. Electronically Signed   By: Signa Kell M.D.   On: 10/17/2020 10:16   CT Renal Stone Study  Result Date: 10/17/2020 CLINICAL DATA:  87 year old male with flank pain and altered mental status. UTI. EXAM: CT ABDOMEN AND PELVIS WITHOUT CONTRAST TECHNIQUE: Multidetector CT imaging of the abdomen and pelvis was performed following the standard protocol without IV contrast. COMPARISON:  None. FINDINGS: Lower chest: Calcified coronary artery atherosclerosis. No pericardial or pleural effusion. Mild elevation of the left hemidiaphragm. Mild lung base atelectasis. Hepatobiliary: Cholelithiasis in the neck of the gallbladder  on series 2, image 30. No pericholecystic inflammation. Negative noncontrast liver. Pancreas: Negative. Spleen: Negative. Adrenals/Urinary Tract: Normal adrenal glands. Difficult to exclude punctate nephrolithiasis in the left upper pole. But no other renal calculus identified. No hydronephrosis. No hydroureter. Evidence of a small left renal midpole cyst. Both ureters remain within normal limits to the bladder. Extensive small layering stones are present throughout the urinary bladder (series 2, image 74, ranging from punctate to 4 mm diameter. Mildly distended urinary bladder. No perivesical stranding. Stomach/Bowel: Normal appendix on series 2, image 49. The cecum does appear to beyond a lax mesentery. No dilated or inflamed bowel is evident. Stomach and duodenum are decompressed. No free air or free fluid. Vascular/Lymphatic: Extensive Aortoiliac calcified atherosclerosis. Normal caliber abdominal aorta. Vascular patency is not evaluated in the absence of IV contrast. No lymphadenopathy. Reproductive: Prostatomegaly. Other: No pelvic  free fluid. Musculoskeletal: Advanced lumbar disc and endplate degeneration. Multilevel vacuum disc. No acute osseous abnormality identified. IMPRESSION: 1. Extensive gravel-like stones layering throughout the urinary bladder. But trace if any superimposed nephrolithiasis, and no acute obstructive uropathy. 2. Cholelithiasis without CT evidence of acute cholecystitis. 3. Calcified coronary artery and Aortic Atherosclerosis (ICD10-I70.0). 4. Advanced lumbar spine degeneration. Electronically Signed   By: Odessa Fleming M.D.   On: 10/17/2020 11:30   DG Hip Unilat W or Wo Pelvis 2-3 Views Left  Result Date: 10/17/2020 CLINICAL DATA:  Status post fall. EXAM: DG HIP (WITH OR WITHOUT PELVIS) 2-3V LEFT COMPARISON:  None FINDINGS: There is no evidence of hip fracture or dislocation. Mild left hip osteoarthritis. Degenerative disc disease noted within the visualized portions of the lumbar  spine. IMPRESSION: 1. No acute findings. 2. Mild left hip osteoarthritis. Electronically Signed   By: Signa Kell M.D.   On: 10/17/2020 10:20        Scheduled Meds:  divalproex  125 mg Oral BID   FLUoxetine  20 mg Oral Daily   risperiDONE  1 mg Oral QHS   tamsulosin  0.4 mg Oral Daily   traZODone  100 mg Oral QHS   Continuous Infusions:  cefTRIAXone (ROCEPHIN)  IV 1 g (10/18/20 1057)     LOS: 0 days    Time spent: 25 minutes    Tresa Moore, MD [facility] Pager [phone]  If 7PM-7AM, please contact night-coverage 10/18/2020, 1:25 PM

## 2020-10-18 NOTE — Plan of Care (Signed)
  Problem: Education: Goal: Knowledge of General Education information will improve Description: Including pain rating scale, medication(s)/side effects and non-pharmacologic comfort measures Outcome: Not Progressing   Problem: Health Behavior/Discharge Planning: Goal: Ability to manage health-related needs will improve Outcome: Not Progressing   Problem: Clinical Measurements: Goal: Ability to maintain clinical measurements within normal limits will improve Outcome: Not Progressing Goal: Will remain free from infection Outcome: Not Progressing Goal: Diagnostic test results will improve Outcome: Not Progressing Goal: Respiratory complications will improve Outcome: Not Progressing Goal: Cardiovascular complication will be avoided Outcome: Not Progressing   Problem: Activity: Goal: Risk for activity intolerance will decrease Outcome: Not Progressing   Problem: Coping: Goal: Level of anxiety will decrease Outcome: Not Progressing   Problem: Elimination: Goal: Will not experience complications related to bowel motility Outcome: Not Progressing Goal: Will not experience complications related to urinary retention Outcome: Not Progressing   Problem: Skin Integrity: Goal: Risk for impaired skin integrity will decrease Outcome: Not Progressing

## 2020-10-18 NOTE — Consult Note (Signed)
WOC Nurse Consult Note: Reason for Consult:Patient found down at ALF. Bilateral knees with eschar and surrounding erythema, L>R. Bilateral feet with xerosis, metatarsal heads with hemorraghic areas vs stool staining. Right great toe lateral aspect with partial thickness skin loss. Bilateral arms with ecchymosis, right forearm with deep tissue pressure injury Wound type:Trauma, pressure Pressure Injury POA: Yes Measurement:To be obtained by Bedside Nurse today and documented on Nursing Flow sheet  Wound bed: Black eschars to bilateral knees, L>R and to right forearm.  Partial thickness skin loss to RGT at lateral aspect.  Bilateral feet with xerosis and metatarsal head region with black staining: hemorraghic vs staining with stool. Drainage (amount, consistency, odor) Scant from right great toe. All others dry. Periwound: Dressing procedure/placement/frequency: I have provided Nursing with guidance for topical care of the POA lesions, including soap and water cleanse of the bilateral LEs followed by a gentle pat dry and application of an emollient, Eucerin Cream. This is to be applied to bilateral knees, legs, feet and not between toes. The knees and right great toe lesions will be dressed with xeroform gauze. Bilateral LEs will be cleansed and moisturized twice daily. Pressure injury prevention interventions will be a sacral foam, bilateral pressure redistribution heel boots and turning and repositioning.  A communication to Dr. Georgeann Oppenheim via Secure Chat contains a recommendation for Orthopedic assessment of the bilateral knees, L>R.  WOC nursing team will not follow, but will remain available to this patient, the nursing and medical teams.  Please re-consult if needed. Thanks, Ladona Mow, MSN, RN, GNP, [facility] [place]  Pager# [phone]

## 2020-10-19 DIAGNOSIS — Z7189 Other specified counseling: Secondary | ICD-10-CM

## 2020-10-19 DIAGNOSIS — E43 Unspecified severe protein-calorie malnutrition: Secondary | ICD-10-CM | POA: Insufficient documentation

## 2020-10-19 LAB — URINE CULTURE: Culture: >=100000 — AB

## 2020-10-19 MED ORDER — NEPRO/CARBSTEADY PO LIQD
237.0000 mL | Freq: Two times a day (BID) | ORAL | Status: DC
  Administered 2020-10-19 – 2020-10-20 (×3): 237 mL via ORAL

## 2020-10-19 MED ORDER — ENOXAPARIN SODIUM 40 MG/0.4ML IJ SOSY
40.0000 mg | PREFILLED_SYRINGE | INTRAMUSCULAR | Status: DC
  Administered 2020-10-19: 40 mg via SUBCUTANEOUS
  Filled 2020-10-19: qty 0.4

## 2020-10-19 MED ORDER — ADULT MULTIVITAMIN W/MINERALS CH
1.0000 | ORAL_TABLET | Freq: Every day | ORAL | Status: DC
  Administered 2020-10-19 – 2020-10-20 (×2): 1 via ORAL
  Filled 2020-10-19 (×2): qty 1

## 2020-10-19 MED ORDER — BISACODYL 10 MG RE SUPP: 10.0000 mg | Freq: Every day | RECTAL | Status: DC | PRN

## 2020-10-19 MED ORDER — TRAZODONE HCL 50 MG PO TABS
50.0000 mg | ORAL_TABLET | Freq: Every day | ORAL | Status: DC
  Administered 2020-10-19: 50 mg via ORAL
  Filled 2020-10-19: qty 1

## 2020-10-19 NOTE — Progress Notes (Addendum)
Initial Nutrition Assessment  DOCUMENTATION CODES:  Underweight, Severe malnutrition in context of social or environmental circumstances  INTERVENTION:  Adjust diet to DYS 3, nectar thick liquid.  Recommend SLP evaluation  Nepro Shake po BID, each supplement provides 425 kcal and 19 grams protein Request new weight Nursing staff to assist with feeding MVI with minerals  NUTRITION DIAGNOSIS:  Severe Malnutrition (in the context of social/environmental factors) related to  (inability to consume adequate nutrition) as evidenced by severe fat depletion, severe muscle depletion.  GOAL:  Patient will meet greater than or equal to 90% of their needs  MONITOR:  PO intake, Supplement acceptance, Labs, Weight trends  REASON FOR ASSESSMENT:  Other (Comment) (Low BMI)    ASSESSMENT:  87 y.o. male with dementia, HLD, and HTN, presented to ED from facility after a fall. Pt found down by staff, for known amount of time but pt noted to have stool and urine on self with abrasions noted. Seen in the ED the night prior for weakness but dc back to facility.   Pt resting in bed at the time of visit. Did not open eyes when name was called, but did allow for physical exam. Significant muscle and fat deficits present. Pt did ask for something to drink, noted nectar thick liquids at bedside at a diet order for pudding thick. No SLP evaluation available at this time. [facility] but did not note dysphagia in medical history listed there.   Discussed intake and diet with RN. Reports that pt requires assistance with meals, unable to feed himself or set up tray. Also states that pt takes medicine in pudding or applesauce, which is likely why his diet ordered was entered as thick liquids. Reports no coughing with nectar. Discussed with MD, will adjust diet to DYS 3 with nectar thick liquids as RN reports that pt needs soft foods. Will let SLP assess for needed adjustments to liquids.    Average Meal Intake: 7/30-8/1: 0% intake x 2 recorded meals  Nutritionally Relevant Medications: Continuous Infusions:  cefTRIAXone (ROCEPHIN)  IV 1 g (10/18/20 1057)   PRN Meds: ondansetron   Labs Reviewed: BUN 37  NUTRITION - FOCUSED PHYSICAL EXAM: Flowsheet Row Most Recent Value  Orbital Region Moderate depletion  Upper Arm Region Severe depletion  Thoracic and Lumbar Region Severe depletion  Buccal Region Moderate depletion  Temple Region Moderate depletion  Clavicle Bone Region Severe depletion  Clavicle and Acromion Bone Region Moderate depletion  Scapular Bone Region Mild depletion  Dorsal Hand Severe depletion  Patellar Region Severe depletion  Anterior Thigh Region Severe depletion  Posterior Calf Region Severe depletion  Edema (RD Assessment) None  Hair Reviewed  [dry flakey scalp]  Eyes Unable to assess  Mouth Unable to assess  Skin Reviewed  Nails Reviewed   Diet Order:   Diet Order             DIET DYS 3 Room service appropriate? No; Fluid consistency: Nectar Thick  Diet effective now                   EDUCATION NEEDS:  No education needs have been identified at this time  Skin:  Skin Assessment: Reviewed RN Assessment  Last BM:  PTA  Height:  Ht Readings from Last 1 Encounters:  10/17/20 5\' 5"  (1.651 m)   Weight:  Wt Readings from Last 1 Encounters:  10/17/20 50 kg    Ideal Body Weight:  61.8  kg  BMI:  Body mass index is 18.34 kg/m.  Estimated Nutritional Needs:  Kcal:  1500-1700 kcal/d Protein:  75-85 g/d Fluid:  >1.5 L/d   Greig Castilla, RD, LDN Clinical Dietitian Pager on Amion

## 2020-10-19 NOTE — Plan of Care (Signed)
Plan of Care Ongoing Problem: Clinical Measurements: Goal: Ability to maintain clinical measurements within normal limits will improve Outcome: Progressing   Problem: Nutrition: Goal: Adequate nutrition will be maintained Outcome: Progressing   Problem: Activity: Goal: Risk for activity intolerance will decrease Outcome: Progressing   Problem: Safety: Goal: Ability to remain free from injury will improve Outcome: Progressing   Problem: Skin Integrity: Goal: Risk for impaired skin integrity will decrease Outcome: Progressing  Patient asleep most of the night but arouses to touch/voice. Oriented to self only. Denies pain. No new changes in assessment. Bed alarm on.

## 2020-10-19 NOTE — Consult Note (Signed)
Consultation Note Date: 10/19/2020   Patient Name: Scott Montgomery  DOB: May 19, 1929  MRN: [id number]  Age / Sex: 87 y.o., male  [facility] Referring Physician: Tresa Moore, MD  Reason for Consultation: Establishing goals of care  HPI/Patient Profile: 87 y.o. male with medical history significant for hyperlipidemia, BPH, hypertension, cataracts, history of colon polyps, dementia, erectile dysfunction, generalized anxiety disorder, osteoarthritis, pseudophakia, presents to the emergency department from facility for chief concerns of altered mentation.  Clinical Assessment and Goals of Care: Patient is resting in bed with eyes closed. Spoke with wife. She discusses the family dynamics. He has 3 children from a previous marriage. She states the 3 biological children seldom call and only come 1-2 times per year. She states he does not call them either.  She states her son's wife is a neonatal NP and has been helpful to her. She states the DIL goes with her to see him once a moth. She states she only goes one a month because he does not know who anyone is and wants them to leave when they come.    We discussed his diagnoses, prognosis, GOC, EOL wishes disposition and options.  Created space and opportunity for patient  to explore thoughts and feelings regarding current medical information.   A detailed discussion was had today regarding advanced directives.  Concepts specific to code status, artifical feeding and hydration, IV antibiotics and rehospitalization were discussed.  The difference between an aggressive medical intervention path and a comfort care path was discussed.  Values and goals of care important to patient and family were attempted to be elicited.  Discussed limitations of medical interventions to prolong quality of life in some situations and discussed the concept  of human mortality.  She states she is his HPOA. She states she does not want me to speak with his 3 children. She confirms DNR status. Will speak further tomorrow.     SUMMARY OF RECOMMENDATIONS   Confirmed DNR. Will follow up tomorrow for further.         Primary Diagnoses: Present on Admission:  Fall  Elevated troponin  Acute lower UTI  BPH (benign prostatic hyperplasia)  Confusion  Dementia with behavioral disturbance (HCC)  Acute metabolic encephalopathy   I have reviewed the medical record, interviewed the patient and family, and examined the patient. The following aspects are pertinent.  Past Medical History:  Diagnosis Date   Dementia (HCC)    Hypertension    Social History   Socioeconomic History   Marital status: Married    Spouse name: Not on file   Number of children: Not on file   Years of education: Not on file   Highest education level: Not on file  Occupational History   Not on file  Tobacco Use   Smoking status: Unknown   Smokeless tobacco: Not on file  Vaping Use   Vaping Use: Unknown  Substance and Sexual Activity   Alcohol use: Not Currently   Drug use: Never  Sexual activity: Not Currently  Other Topics Concern   Not on file  Social History Narrative   Not on file   Social Determinants of Health   Financial Resource Strain: Not on file  Food Insecurity: Not on file  Transportation Needs: Not on file  Physical Activity: Not on file  Stress: Not on file  Social Connections: Not on file   History reviewed. No pertinent family history. Scheduled Meds:  divalproex  125 mg Oral BID   enoxaparin (LOVENOX) injection  40 mg Subcutaneous Q24H   feeding supplement (NEPRO CARB STEADY)  237 mL Oral BID BM   FLUoxetine  20 mg Oral Daily   hydrocerin   Topical BID   multivitamin with minerals  1 tablet Oral Daily   risperiDONE  1 mg Oral QHS   tamsulosin  0.4 mg Oral Daily   traZODone  50 mg Oral QHS   Continuous Infusions: PRN  Meds:.acetaminophen **OR** acetaminophen, haloperidol lactate, LORazepam, ondansetron **OR** ondansetron (ZOFRAN) IV Medications Prior to Admission:  Prior to Admission medications   Medication Sig Start Date End Date Taking? Authorizing Provider  acetaminophen (TYLENOL) 325 MG tablet Take 650 mg by mouth 3 (three) times daily.   Yes [provider]  cholecalciferol (VITAMIN D) 25 MCG (1000 UNIT) tablet Take 2,000 Units by mouth daily.   Yes [provider]  divalproex (DEPAKOTE SPRINKLE) 125 MG capsule Take 125 mg by mouth 2 (two) times daily.   Yes [provider]  FLUoxetine (PROZAC) 20 MG capsule Take 20 mg by mouth daily.   Yes [provider]  methylphenidate (RITALIN) 5 MG tablet Take 5 mg by mouth 2 (two) times daily.   Yes [provider]  risperiDONE (RISPERDAL) 1 MG tablet Take 1 mg by mouth at bedtime.   Yes [provider]  senna-docusate (SENOKOT-S) 8.6-50 MG tablet Take 2 tablets by mouth 2 (two) times daily.   Yes [provider]  tamsulosin (FLOMAX) 0.4 MG CAPS capsule Take 0.4 mg by mouth.   Yes [provider]  traZODone (DESYREL) 100 MG tablet Take 100 mg by mouth at bedtime.   Yes [provider]  vitamin B-12 (CYANOCOBALAMIN) 1000 MCG tablet Take 1,000 mcg by mouth daily.   Yes [provider]  cephALEXin (KEFLEX) 500 MG capsule Take 1 capsule (500 mg total) by mouth 4 (four) times daily for 5 days. 10/16/20 10/21/20  Gilles Chiquito, MD   No Known Allergies Review of Systems  Unable to perform ROS  Physical Exam Constitutional:      Comments: Eyes closed.     Vital Signs: BP (!) 113/52 (BP Location: Left Arm)   Pulse 72   Temp 97.9 F (36.6 C)   Resp 16   Ht 5\' 5"  (1.651 m)   Wt 50 kg   SpO2 99%   BMI 18.34 kg/m  Pain Scale: 0-10   Pain Score: 0-No pain   SpO2: SpO2: 99 % O2 Device:SpO2: 99 % O2 Flow Rate: .   IO: Intake/output summary:  Intake/Output Summary  (Last 24 hours) at 10/19/2020 1549 Last data filed at 10/19/2020 0459 Gross per 24 hour  Intake 5 ml  Output 750 ml  Net -745 ml    LBM: Last BM Date:  (unknown) Baseline Weight: Weight: 50 kg Most recent weight: Weight: 50 kg        Time In: 3:30 Time Out: 4:00 Time Total: 30 min Greater than 50%  of this time was spent counseling  and coordinating care related to the above assessment and plan.  Signed by: Morton Stall, NP   Please contact Palliative Medicine Team phone at [phone] for questions and concerns.  For individual provider: See Loretha Stapler

## 2020-10-19 NOTE — Progress Notes (Signed)
Received order from Dr Georgeann Oppenheim to discontinue cardiac monitoring

## 2020-10-19 NOTE — Progress Notes (Signed)
PROGRESS NOTE    Scott Montgomery  MWU:[id number] DOB: 09-22-1929 DOA: 10/17/2020 [facility]: [facility]   Brief Narrative:  87 y.o. male with medical history significant for hyperlipidemia, BPH, hypertension, cataracts, history of colon polyps, dementia, erectile dysfunction, generalized anxiety disorder, osteoarthritis, pseudophakia, presents to the emergency department from facility for chief concerns of altered mentation.   At bedside patient is able to tell me his name with difficulty.  He was able to tell me his age.  He does attempt to take out his lines and IV.  Bilateral mittens are in place.  Apparently patient had a fall at his assisted living that was unwitnessed.  Unknown time down.  Facility staff noted him to be down and brought him to the ED.  He had evidence of mild rhabdomyolysis, no urinary tract infection, encephalopathy and was admitted to the hospitalist service.  The following morning I spoke at length to his wife and relative at bedside.  Per the family the patient is agitated and unaware of surroundings at baseline.  They were inquiring about post discharge transfer to higher level of care.  Urine culture demonstrated multiple species.  Patient still sleeping.  Wound care consult appreciated.  Palliative care consult requested.   Assessment & Plan:   Principal Problem:   Confusion Active Problems:   Fall   Dementia [facility])   Essential hypertension   Elevated troponin   Acute lower UTI   History of seizure   BPH (benign prostatic hyperplasia)   Dementia with behavioral disturbance (HCC)   Acute metabolic encephalopathy  Acute metabolic encephalopathy Suspect secondary to underlying dementia in the setting of urinary tract infection Patient was previously treated with p.o. Keflex on 7/29, adherence unclear Urine culture with bacteria but multiple species Plan: Unable to determine true presence of urinary tract infection.  We will continue  Rocephin for 3 total doses.  Found down Unknown time down, moving all extremities CT head on admission with left posterior convexity scalp hematoma No skull fracture, no intracranial abnormality Plan: Fall precautions Delirium precautions  Dementia with agitation Per the family at bedside the patient is agitated at baseline Plan: Continue as needed Haldol Low-dose benzodiazepines as needed Palliative care has been consulted  # Hyperlipidemia on simvastatin 40 mg previously,   # History of seizure - On Depakote 125 mg p.o. twice daily, seizure precaution  # B12 deficiency   # BPH-finasteride 5 mg daily, tamsulosin 0.5 mg daily terazosin 10 mg daily, # Depression/anxiety-fluoxetine 20 mg daily resumed # Insomnia-trazodone 50 mg daily at bedtime   DVT prophylaxis: TED hose Code Status: DNR, discussed and confirmed with family at bedside Family Communication: Wife and relative at bedside.  Wife is MPOA Disposition Plan: Status is: Inpatient  Remains inpatient appropriate because:Altered mental status and Inpatient level of care appropriate due to severity of illness  Dispo: The patient is from: ALF              Anticipated d/c is to:  TBD              Patient currently is not medically stable to d/c.   Difficult to place patient No        Unclear disposition plan at this time.  Palliative care consult pending.  Patient likely cannot return to [facility] with his current level of functioning     Level of care: Med-Surg  Consultants:  None  Procedures:  None  Antimicrobials:  Ceftriaxone   Subjective: Seen and  examined.  Sedated.  Unable to provide history  Objective: Vitals:   10/18/20 1047 10/18/20 1925 10/19/20 0510 10/19/20 0755  BP: (!) 154/83 (!) 166/64 (!) 153/64 (!) 103/38  Pulse: (!) 52 (!) 52 71 66  Resp:  19 17 16   Temp:  98 F (36.7 C) 98.4 F (36.9 C) 98.5 F (36.9 C)  TempSrc:      SpO2:  98% 97% 93%  Weight:      Height:         Intake/Output Summary (Last 24 hours) at 10/19/2020 1312 Last data filed at 10/19/2020 0459 Gross per 24 hour  Intake 5 ml  Output 750 ml  Net -745 ml   Filed Weights   10/17/20 0730  Weight: 50 kg    Examination:  General exam: Sedated.  Arouses to voice and touch. Respiratory system: Lungs overall clear.  Normal work of breathing.  Room air Cardiovascular system: S1-S2, regular rhythm, no murmurs  gastrointestinal system: Thin/scaphoid, nontender, nondistended, normal bowel sounds Central nervous system: Lethargic, oriented x1 Extremities: Unable to assess power Skin: Thin and pale with scattered excoriations Psychiatry: Judgement and insight appear impaired. Mood & affect flattened.     Data Reviewed: I have personally reviewed following labs and imaging studies  CBC: Recent Labs  Lab 10/16/20 1324 10/17/20 0821 10/17/20 2226 10/18/20 0555  WBC 9.4 13.5* 10.8* 9.0  NEUTROABS  --  11.4*  --   --   HGB 13.5 13.5 12.7* 12.6*  HCT 40.0 40.8 38.2* 37.5*  MCV 97.3 98.3 100.3* 100.5*  PLT 153 147* 131* 127*   Basic Metabolic Panel: Recent Labs  Lab 10/16/20 1324 10/17/20 0821 10/18/20 0555  NA 141 143 142  K 3.9 3.9 3.9  CL 106 103 106  CO2 25 26 30   GLUCOSE 122* 128* 76  BUN 23 31* 37*  CREATININE 1.03 1.07 0.90  CALCIUM 9.5 10.0 8.9  MG  --  2.1  --    GFR: Estimated Creatinine Clearance: 39.4 mL/min (by C-G formula based on SCr of 0.9 mg/dL). Liver Function Tests: Recent Labs  Lab 10/17/20 0821  AST 117*  ALT 37  ALKPHOS 39  BILITOT 1.4*  PROT 6.4*  ALBUMIN 3.9   No results for input(s): LIPASE, AMYLASE in the last 168 hours. No results for input(s): AMMONIA in the last 168 hours. Coagulation Profile: Recent Labs  Lab 10/17/20 1810  INR 1.0   Cardiac Enzymes: Recent Labs  Lab 10/17/20 0821  CKTOTAL 4,562*   BNP (last 3 results) No results for input(s): PROBNP in the last 8760 hours. HbA1C: No results for input(s): HGBA1C in the  last 72 hours. CBG: No results for input(s): GLUCAP in the last 168 hours. Lipid Profile: No results for input(s): CHOL, HDL, LDLCALC, TRIG, CHOLHDL, LDLDIRECT in the last 72 hours. Thyroid Function Tests: Recent Labs    10/17/20 1553  TSH 1.498   Anemia Panel: No results for input(s): VITAMINB12, FOLATE, FERRITIN, TIBC, IRON, RETICCTPCT in the last 72 hours. Sepsis Labs: No results for input(s): PROCALCITON, LATICACIDVEN in the last 168 hours.  Recent Results (from the past 240 hour(s))  Urine Culture     Status: Abnormal   Collection Time: 10/16/20  3:49 PM   Specimen: Urine, Random  Result Value Ref Range Status   Specimen Description   Final    URINE, RANDOM Performed at [facility], [address], [place], [address] [place]    Special Requests   Final    NONE  Performed at [facility], [address] [place], [place] [address]    Culture >=100,000 COLONIES/mL STAPHYLOCOCCUS EPIDERMIDIS (A)  Final   Report Status 10/19/2020 FINAL  Final   Organism ID, Bacteria STAPHYLOCOCCUS EPIDERMIDIS (A)  Final      Susceptibility   Staphylococcus epidermidis - MIC*    CIPROFLOXACIN <=0.5 SENSITIVE Sensitive     GENTAMICIN <=0.5 SENSITIVE Sensitive     NITROFURANTOIN 32 SENSITIVE Sensitive     OXACILLIN <=0.25 SENSITIVE Sensitive     TETRACYCLINE <=1 SENSITIVE Sensitive     VANCOMYCIN 1 SENSITIVE Sensitive     TRIMETH/SULFA <=10 SENSITIVE Sensitive     CLINDAMYCIN <=0.25 SENSITIVE Sensitive     RIFAMPIN <=0.5 SENSITIVE Sensitive     Inducible Clindamycin NEGATIVE Sensitive     * >=100,000 COLONIES/mL STAPHYLOCOCCUS EPIDERMIDIS  Urine Culture     Status: Abnormal   Collection Time: 10/17/20  8:21 AM   Specimen: Urine, Clean Catch  Result Value Ref Range Status   Specimen Description   Final    URINE, CLEAN CATCH Performed at [facility], [address], [place], [place] [address]    Special Requests   Final    NONE Performed at [facility], [address] [place], [place] [address]    Culture MULTIPLE SPECIES PRESENT, SUGGEST RECOLLECTION (A)  Final   Report Status 10/18/2020 FINAL  Final  Resp Panel by RT-PCR (Flu A&B, Covid) Nasopharyngeal Swab     Status: None   Collection Time: 10/17/20 10:53 AM   Specimen: Nasopharyngeal Swab; Nasopharyngeal(NP) swabs in vial transport medium  Result Value Ref Range Status   SARS Coronavirus 2 by RT PCR NEGATIVE NEGATIVE Final    Comment: (NOTE) SARS-CoV-2 target nucleic acids are NOT DETECTED.  The SARS-CoV-2 RNA is generally detectable in upper respiratory specimens during the acute phase of infection. The lowest concentration of SARS-CoV-2 viral copies this assay can detect is 138 copies/mL. A negative result does not preclude SARS-Cov-2 infection and should not be used as the sole basis for treatment or other patient management decisions. A negative result may occur with  improper specimen collection/handling, submission of specimen other than nasopharyngeal swab, presence of viral mutation(s) within the areas targeted by this assay, and inadequate number of viral copies(<138 copies/mL). A negative result must be combined with clinical observations, patient history, and epidemiological information. The expected result is Negative.  Fact Sheet for Patients:  BloggerCourse.com  Fact Sheet for Healthcare Providers:  SeriousBroker.it  This test is no t yet approved or cleared by the [place] FDA and  has been authorized for detection and/or diagnosis of SARS-CoV-2 by FDA under an Emergency Use Authorization (EUA). This EUA will remain  in effect (meaning this test can be used) for the duration of the COVID-19 declaration under Section 564(b)(1) of the Act, 21 U.S.C.section 360bbb-3(b)(1), unless the authorization is terminated  or revoked sooner.       Influenza A by PCR NEGATIVE NEGATIVE Final    Influenza B by PCR NEGATIVE NEGATIVE Final    Comment: (NOTE) The Xpert Xpress SARS-CoV-2/FLU/RSV plus assay is intended as an aid in the diagnosis of influenza from Nasopharyngeal swab specimens and should not be used as a sole basis for treatment. Nasal washings and aspirates are unacceptable for Xpert Xpress SARS-CoV-2/FLU/RSV testing.  Fact Sheet for Patients: BloggerCourse.com  Fact Sheet for Healthcare Providers: SeriousBroker.it  This test is not yet approved or cleared by the [place] and has been authorized  for detection and/or diagnosis of SARS-CoV-2 by FDA under an Emergency Use Authorization (EUA). This EUA will remain in effect (meaning this test can be used) for the duration of the COVID-19 declaration under Section 564(b)(1) of the Act, 21 U.S.C. section 360bbb-3(b)(1), unless the authorization is terminated or revoked.  Performed at [facility], [address] [place], [place] [address]          Radiology Studies: No results found.      Scheduled Meds:  divalproex  125 mg Oral BID   enoxaparin (LOVENOX) injection  40 mg Subcutaneous Q24H   FLUoxetine  20 mg Oral Daily   hydrocerin   Topical BID   risperiDONE  1 mg Oral QHS   tamsulosin  0.4 mg Oral Daily   traZODone  50 mg Oral QHS   Continuous Infusions:     LOS: 1 day    Time spent: 25 minutes    Tresa MooreSudheer B Wilho Sharpley, MD [facility] Pager [phone]  If 7PM-7AM, please contact night-coverage 10/19/2020, 1:12 PM

## 2020-10-20 ENCOUNTER — Emergency Department: Payer: No Typology Code available for payment source

## 2020-10-20 ENCOUNTER — Other Ambulatory Visit: Payer: Self-pay

## 2020-10-20 ENCOUNTER — Inpatient Hospital Stay
Admission: EM | Admit: 2020-10-20 | Discharge: 2020-11-04 | DRG: 177 | Disposition: A | Discharge status: Skilled Nursing Facility | Payer: No Typology Code available for payment source | Source: Skilled Nursing Facility | Attending: Internal Medicine | Admitting: Internal Medicine

## 2020-10-20 DIAGNOSIS — J690 Pneumonitis due to inhalation of food and vomit: Principal | ICD-10-CM | POA: Diagnosis present

## 2020-10-20 DIAGNOSIS — Z23 Encounter for immunization: Secondary | ICD-10-CM

## 2020-10-20 DIAGNOSIS — E785 Hyperlipidemia, unspecified: Secondary | ICD-10-CM | POA: Diagnosis present

## 2020-10-20 DIAGNOSIS — R627 Adult failure to thrive: Secondary | ICD-10-CM | POA: Diagnosis present

## 2020-10-20 DIAGNOSIS — R1310 Dysphagia, unspecified: Secondary | ICD-10-CM | POA: Diagnosis present

## 2020-10-20 DIAGNOSIS — S0081XA Abrasion of other part of head, initial encounter: Secondary | ICD-10-CM | POA: Diagnosis present

## 2020-10-20 DIAGNOSIS — Z66 Do not resuscitate: Secondary | ICD-10-CM | POA: Diagnosis present

## 2020-10-20 DIAGNOSIS — G9341 Metabolic encephalopathy: Secondary | ICD-10-CM | POA: Diagnosis present

## 2020-10-20 DIAGNOSIS — Z9119 Patient's noncompliance with other medical treatment and regimen: Secondary | ICD-10-CM

## 2020-10-20 DIAGNOSIS — R1319 Other dysphagia: Secondary | ICD-10-CM | POA: Diagnosis present

## 2020-10-20 DIAGNOSIS — F0391 Unspecified dementia with behavioral disturbance: Secondary | ICD-10-CM | POA: Diagnosis present

## 2020-10-20 DIAGNOSIS — Z20822 Contact with and (suspected) exposure to covid-19: Secondary | ICD-10-CM | POA: Diagnosis present

## 2020-10-20 DIAGNOSIS — R296 Repeated falls: Secondary | ICD-10-CM | POA: Diagnosis present

## 2020-10-20 DIAGNOSIS — L89896 Pressure-induced deep tissue damage of other site: Secondary | ICD-10-CM | POA: Diagnosis present

## 2020-10-20 DIAGNOSIS — Z5320 Procedure and treatment not carried out because of patient's decision for unspecified reasons: Secondary | ICD-10-CM | POA: Diagnosis present

## 2020-10-20 DIAGNOSIS — F03918 Unspecified dementia, unspecified severity, with other behavioral disturbance: Secondary | ICD-10-CM | POA: Diagnosis present

## 2020-10-20 DIAGNOSIS — W19XXXA Unspecified fall, initial encounter: Secondary | ICD-10-CM | POA: Diagnosis present

## 2020-10-20 DIAGNOSIS — J9601 Acute respiratory failure with hypoxia: Secondary | ICD-10-CM | POA: Diagnosis present

## 2020-10-20 DIAGNOSIS — Z681 Body mass index (BMI) 19 or less, adult: Secondary | ICD-10-CM

## 2020-10-20 DIAGNOSIS — Z79899 Other long term (current) drug therapy: Secondary | ICD-10-CM

## 2020-10-20 DIAGNOSIS — E43 Unspecified severe protein-calorie malnutrition: Secondary | ICD-10-CM | POA: Diagnosis present

## 2020-10-20 DIAGNOSIS — N400 Enlarged prostate without lower urinary tract symptoms: Secondary | ICD-10-CM | POA: Diagnosis present

## 2020-10-20 DIAGNOSIS — F0390 Unspecified dementia without behavioral disturbance: Secondary | ICD-10-CM

## 2020-10-20 DIAGNOSIS — I10 Essential (primary) hypertension: Secondary | ICD-10-CM | POA: Diagnosis present

## 2020-10-20 LAB — COMPREHENSIVE METABOLIC PANEL
ALT: 67 U/L — ABNORMAL HIGH (ref 0–44)
AST: 131 U/L — ABNORMAL HIGH (ref 15–41)
Albumin: 3.3 g/dL — ABNORMAL LOW (ref 3.5–5.0)
Alkaline Phosphatase: 41 U/L (ref 38–126)
Anion gap: 10 (ref 5–15)
BUN: 27 mg/dL — ABNORMAL HIGH (ref 8–23)
CO2: 27 mmol/L (ref 22–32)
Calcium: 9.1 mg/dL (ref 8.9–10.3)
Chloride: 103 mmol/L (ref 98–111)
Creatinine, Ser: 0.94 mg/dL (ref 0.61–1.24)
GFR, Estimated: >60 mL/min (ref >60)
Glucose, Bld: 123 mg/dL — ABNORMAL HIGH (ref 70–99)
Potassium: 4 mmol/L (ref 3.5–5.1)
Sodium: 140 mmol/L (ref 135–145)
Total Bilirubin: 1 mg/dL (ref 0.3–1.2)
Total Protein: 6.5 g/dL (ref 6.5–8.1)

## 2020-10-20 LAB — CBC WITH DIFFERENTIAL/PLATELET
Abs Immature Granulocytes: 0.05 10*3/uL (ref 0.00–0.07)
Basophils Absolute: 0 10*3/uL (ref 0.0–0.1)
Basophils Relative: 0 %
Eosinophils Absolute: 0 10*3/uL (ref 0.0–0.5)
Eosinophils Relative: 0 %
HCT: 39.8 % (ref 39.0–52.0)
Hemoglobin: 13.4 g/dL (ref 13.0–17.0)
Immature Granulocytes: 1 %
Lymphocytes Relative: 18 %
Lymphs Abs: 1.8 10*3/uL (ref 0.7–4.0)
MCH: 33.1 pg (ref 26.0–34.0)
MCHC: 33.7 g/dL (ref 30.0–36.0)
MCV: 98.3 fL (ref 80.0–100.0)
Monocytes Absolute: 1.1 10*3/uL — ABNORMAL HIGH (ref 0.1–1.0)
Monocytes Relative: 11 %
Neutro Abs: 7 10*3/uL (ref 1.7–7.7)
Neutrophils Relative %: 70 %
Platelets: 143 10*3/uL — ABNORMAL LOW (ref 150–400)
RBC: 4.05 MIL/uL — ABNORMAL LOW (ref 4.22–5.81)
RDW: 12.1 % (ref 11.5–15.5)
WBC: 10 10*3/uL (ref 4.0–10.5)
nRBC: 0 % (ref 0.0–0.2)

## 2020-10-20 LAB — RESP PANEL BY RT-PCR (FLU A&B, COVID) ARPGX2
Influenza A by PCR: NEGATIVE
Influenza A by PCR: NEGATIVE
Influenza B by PCR: NEGATIVE
Influenza B by PCR: NEGATIVE
SARS Coronavirus 2 by RT PCR: NEGATIVE
SARS Coronavirus 2 by RT PCR: NEGATIVE

## 2020-10-20 MED ORDER — METHYLPHENIDATE HCL 5 MG PO TABS
5.0000 mg | ORAL_TABLET | Freq: Two times a day (BID) | ORAL | Status: DC
  Administered 2020-10-21 – 2020-10-24 (×8): 5 mg via ORAL
  Filled 2020-10-20 (×8): qty 1

## 2020-10-20 MED ORDER — FLUOXETINE HCL 20 MG PO CAPS
20.0000 mg | ORAL_CAPSULE | Freq: Every day | ORAL | Status: DC
  Administered 2020-10-21 – 2020-10-24 (×4): 20 mg via ORAL
  Filled 2020-10-20 (×5): qty 1

## 2020-10-20 MED ORDER — VITAMIN B-12 1000 MCG PO TABS
1000.0000 ug | ORAL_TABLET | Freq: Every day | ORAL | Status: DC
  Administered 2020-10-21 – 2020-10-24 (×4): 1000 ug via ORAL
  Filled 2020-10-20 (×4): qty 1

## 2020-10-20 MED ORDER — ACETAMINOPHEN 325 MG PO TABS
650.0000 mg | ORAL_TABLET | Freq: Three times a day (TID) | ORAL | Status: DC
  Administered 2020-10-21 – 2020-10-24 (×8): 650 mg via ORAL
  Filled 2020-10-20 (×8): qty 2

## 2020-10-20 MED ORDER — TAMSULOSIN HCL 0.4 MG PO CAPS
0.4000 mg | ORAL_CAPSULE | Freq: Every day | ORAL | Status: DC
  Administered 2020-10-21 – 2020-10-24 (×4): 0.4 mg via ORAL
  Filled 2020-10-20 (×4): qty 1

## 2020-10-20 MED ORDER — TETANUS-DIPHTH-ACELL PERTUSSIS 5-2.5-18.5 LF-MCG/0.5 IM SUSY
0.5000 mL | PREFILLED_SYRINGE | Freq: Once | INTRAMUSCULAR | Status: AC
  Administered 2020-10-20: 0.5 mL via INTRAMUSCULAR
  Filled 2020-10-20: qty 0.5

## 2020-10-20 MED ORDER — DIVALPROEX SODIUM 125 MG PO CSDR
125.0000 mg | DELAYED_RELEASE_CAPSULE | Freq: Two times a day (BID) | ORAL | Status: DC
  Administered 2020-10-21 – 2020-10-24 (×7): 125 mg via ORAL
  Filled 2020-10-20 (×8): qty 1

## 2020-10-20 MED ORDER — TRAZODONE HCL 100 MG PO TABS
100.0000 mg | ORAL_TABLET | Freq: Every day | ORAL | Status: DC
  Administered 2020-10-21 – 2020-10-23 (×4): 100 mg via ORAL
  Filled 2020-10-20 (×4): qty 1

## 2020-10-20 MED ORDER — RISPERIDONE 1 MG PO TABS
1.0000 mg | ORAL_TABLET | Freq: Every day | ORAL | Status: DC
  Administered 2020-10-21 – 2020-10-23 (×4): 1 mg via ORAL
  Filled 2020-10-20 (×4): qty 1

## 2020-10-20 MED ORDER — VITAMIN D 25 MCG (1000 UNIT) PO TABS
2000.0000 [IU] | ORAL_TABLET | Freq: Every day | ORAL | Status: DC
  Administered 2020-10-21 – 2020-10-24 (×4): 2000 [IU] via ORAL
  Filled 2020-10-20 (×4): qty 2

## 2020-10-20 MED ORDER — ACETAMINOPHEN 500 MG PO TABS
1000.0000 mg | ORAL_TABLET | Freq: Once | ORAL | Status: AC
  Administered 2020-10-20: 1000 mg via ORAL
  Filled 2020-10-20: qty 2

## 2020-10-20 MED ORDER — SENNOSIDES-DOCUSATE SODIUM 8.6-50 MG PO TABS
2.0000 | ORAL_TABLET | Freq: Two times a day (BID) | ORAL | Status: DC
  Administered 2020-10-21 – 2020-10-24 (×8): 2 via ORAL
  Filled 2020-10-20 (×8): qty 2

## 2020-10-20 NOTE — Progress Notes (Addendum)
Daily Progress Note   Patient Name: Scott Montgomery       Date: 10/20/2020 DOB: 12-31-1929  Age: 87 y.o. MRN#: [id number] Attending Physician: Tresa Moore, MD Primary Care Physician: [facility] Admit Date: 10/17/2020  Reason for Consultation/Follow-up: Establishing goals of care  Subjective: Patient is sitting up in bed eating lunch. He is very neat and deliberate with his bites. He tells me his name. He states he is currently divorced but has 3 children and 2 step children. He cannot tell me where we are or where he lives. He has no complaints at this time. No family at bedside. Spoke with TOC, currently working to D/C back to his facility with palliative to follow there.  Recommend palliative to follow for further GOC conversations, and provide time for outcomes.   Length of Stay: 2  Current Medications: Scheduled Meds:  . divalproex  125 mg Oral BID  . enoxaparin (LOVENOX) injection  40 mg Subcutaneous Q24H  . feeding supplement (NEPRO CARB STEADY)  237 mL Oral BID BM  . FLUoxetine  20 mg Oral Daily  . hydrocerin   Topical BID  . multivitamin with minerals  1 tablet Oral Daily  . risperiDONE  1 mg Oral QHS  . tamsulosin  0.4 mg Oral Daily  . traZODone  50 mg Oral QHS    Continuous Infusions:   PRN Meds: acetaminophen **OR** acetaminophen, bisacodyl, LORazepam, ondansetron **OR** ondansetron (ZOFRAN) IV  Physical Exam Pulmonary:     Effort: Pulmonary effort is normal.  Neurological:     Mental Status: He is alert.            Vital Signs: BP (!) 150/79 (BP Location: Right Arm)   Pulse 67   Temp 98.1 F (36.7 C) (Oral)   Resp 16   Ht 5\' 5"  (1.651 m)   Wt 50 kg   SpO2 98%   BMI 18.35 kg/m  SpO2: SpO2: 98 % O2 Device: O2 Device: Room  Air O2 Flow Rate:    Intake/output summary:  Intake/Output Summary (Last 24 hours) at 10/20/2020 1327 Last data filed at 10/20/2020 1020 Gross per 24 hour  Intake 357 ml  Output 1200 ml  Net -843 ml   LBM: Last BM Date: 10/19/20 Baseline Weight: Weight: 50 kg Most recent weight: Weight: 50  kg         Patient Active Problem List   Diagnosis Date Noted  . Protein-calorie malnutrition, severe 10/19/2020  . Acute metabolic encephalopathy 10/18/2020  . Fall 10/17/2020  . Dementia (HCC) 10/17/2020  . Essential hypertension 10/17/2020  . Elevated troponin 10/17/2020  . Acute lower UTI 10/17/2020  . History of seizure 10/17/2020  . BPH (benign prostatic hyperplasia) 10/17/2020  . Confusion 10/17/2020  . Dementia with behavioral disturbance (HCC) 10/17/2020    Palliative Care Assessment & Plan     Recommendations/Plan: Recommend palliative at D/C.     Code Status:    Code Status Orders  (From admission, onward)           Start     Ordered   10/18/20 1206  Do not attempt resuscitation (DNR)  Continuous       Question Answer Comment  In the event of cardiac or respiratory ARREST Do not call a "code blue"   In the event of cardiac or respiratory ARREST Do not perform Intubation, CPR, defibrillation or ACLS   In the event of cardiac or respiratory ARREST Use medication by any route, position, wound care, and other measures to relive pain and suffering. May use oxygen, suction and manual treatment of airway obstruction as needed for comfort.      10/18/20 1205           Code Status History     Date Active Date Inactive Code Status Order ID Comments User Context   10/17/2020 1518 10/18/2020 1205 Full Code [id number]  Cox, Nadyne Coombes, DO ED       Prognosis:  Unable to determine    Care plan was discussed with [facility]  Thank you for allowing the Palliative Medicine Team to assist in the care of this patient.       Total Time 15 min Prolonged Time Billed  no        Greater than 50%  of this time was spent counseling and coordinating care related to the above assessment and plan.  Morton Stall, NP  Please contact Palliative Medicine Team phone at [phone] for questions and concerns.

## 2020-10-20 NOTE — TOC Progression Note (Signed)
Transition of Care [facility]) - Progression Note    Patient Details  Name: Scott Montgomery MRN: [id number] Date of Birth: 02/28/1930  Transition of Care [facility] CM/SW Contact  Barrie Dunker, RN Phone Number: 10/20/2020, 1:43 PM  Clinical Narrative:     POA paperwork was received and placed on his chart, spoke with Joyce Gross at [facility], they are able to take him back with an FL2, and any orders for meds need to be in a written script, Notified The Physician        Expected Discharge Plan and Services                                                 Social Determinants of Health (SDOH) Interventions    Readmission Risk Interventions No flowsheet data found.

## 2020-10-20 NOTE — ED Notes (Signed)
Non slip socks applied at this time.

## 2020-10-20 NOTE — TOC Progression Note (Signed)
Transition of Care [facility]) - Progression Note    Patient Details  Name: Scott Montgomery MRN: [id number] Date of Birth: Aug 08, 1929  Transition of Care [facility]) CM/SW Contact  Barrie Dunker, RN Phone Number: 10/20/2020, 3:32 PM  Clinical Narrative:     [facility] to transport back to [facility], there are severl patient's ahead of him, Scott Montgomery is aware       Expected Discharge Plan and Services           Expected Discharge Date: 10/20/20                                     Social Determinants of Health (SDOH) Interventions    Readmission Risk Interventions No flowsheet data found.

## 2020-10-20 NOTE — ED Notes (Signed)
xr at bedside

## 2020-10-20 NOTE — Discharge Summary (Signed)
Physician Discharge Summary  Ridgely Anastacio BOF:[id number] DOB: Oct 30, 1929 DOA: 10/17/2020  PCP: [facility]  Admit date: 10/17/2020 Discharge date: 10/20/2020  Admitted From: ALF Disposition: ALF  Recommendations for Outpatient Follow-up:  Follow up with PCP in 1-2 weeks Outpatient referral to palliative care  Home Health: No Equipment/Devices: None  Discharge Condition: Stable CODE STATUS: DNR Diet recommendation: Dysphagia  Brief/Interim Summary: 87 y.o. male with medical history significant for hyperlipidemia, BPH, hypertension, cataracts, history of colon polyps, dementia, erectile dysfunction, generalized anxiety disorder, osteoarthritis, pseudophakia, presents to the emergency department from facility for chief concerns of altered mentation.   At bedside patient is able to tell me his name with difficulty.  He was able to tell me his age.  He does attempt to take out his lines and IV.  Bilateral mittens are in place.  Apparently patient had a fall at his assisted living that was unwitnessed.  Unknown time down.  Facility staff noted him to be down and brought him to the ED.   He had evidence of mild rhabdomyolysis, no urinary tract infection, encephalopathy and was admitted to the hospitalist service.  The following morning I spoke at length to his wife and relative at bedside.  Per the family the patient is agitated and unaware of surroundings at baseline.  They were inquiring about post discharge transfer to higher level of care.   Urine culture demonstrated multiple species.   Wound care consult appreciated.  Palliative care consult requested.  Bladder care consultation appreciated.  Recommend outpatient referral to palliative care.  I suspect that patient's urine culture did not represent a true infection as he was afebrile and did not have leukocytosis.  I suspect this represents asymptomatic bacteriuria secondary to poor hygiene and self-care.  I strongly  suggest diligent attention at the assisted living facility to the patient's hygiene as this will prevent the development of infections and future hospitalizations.  If need be recommend using antipsychotic as needed medication to allow for safe bathing of the patient.   Discharge Diagnoses:  Principal Problem:   Confusion Active Problems:   Fall   Dementia [facility])   Essential hypertension   Elevated troponin   Acute lower UTI   History of seizure   BPH (benign prostatic hyperplasia)   Dementia with behavioral disturbance (HCC)   Acute metabolic encephalopathy   Protein-calorie malnutrition, severe Acute metabolic encephalopathy Suspect secondary to underlying dementia in the setting of urinary tract infection Patient was previously treated with p.o. Keflex on 7/29, adherence unclear Urine culture with bacteria but multiple species Plan: Patient received 4 doses of Rocephin in house.  As we are unable to truly rule it urinary tract infection I would not continue antibiotics on discharge.  Mental status changes likely representative of progression of underlying dementia.  Recommend outpatient referral to palliative care.  I also recommend diligent attention to patient's hygiene at the assisted living facility.  Consider use of sedatives or antipsychotics prior to bathing attempts to ensure patient and staff safety   Found down Unknown time down, moving all extremities CT head on admission with left posterior convexity scalp hematoma No skull fracture, no intracranial abnormality Plan: Fall precautions Delirium precautions   Dementia with agitation Per the family at bedside the patient is agitated at baseline Plan: Discharge back to assisted living facility.  Recommend as needed Haldol prior to any attempt at bathing.  Recommend diligent attention to patient hygiene.  Recommend outpatient referral to palliative care.   #  Hyperlipidemia on simvastatin 40 mg previously,   # History of  seizure - On Depakote 125 mg p.o. twice daily, seizure precaution   Discharge Instructions  Discharge Instructions     Diet - low sodium heart healthy   Complete by: As directed    Discharge wound care:   Complete by: As directed    Wound care  Daily      Comments: Wound care to right forearm area of tissue trauma, deep tissue pressure injury:  Cleanse with NS, pat dry. Cover with xeroform gauze [employer] [place] # [id number]). Top with dry gauze, ABD pad and secure with a few turns of Kerlix roll gauze/paper tape. Change daily.  10/18/20 1411      10/18/20 1357    Wound care  Every shift      Comments: Wound care to bilateral LEs (including knees):  Cleanse with soap and water, rinse and pat dry. Apply Eucerin cream to LEs and feet, but not between toes. Cover right lateral great toe partial thickness wound and bilateral knee eschars with xeroform gauze and top with dry gauze, secure with Kerlix roll gauze or silicone foam dressing. Place feet into [employer].  10/18/20 1403   Increase activity slowly   Complete by: As directed       Allergies as of 10/20/2020   No Known Allergies      Medication List     STOP taking these medications    cephALEXin 500 MG capsule Commonly known as: KEFLEX       TAKE these medications    acetaminophen 325 MG tablet Commonly known as: TYLENOL Take 650 mg by mouth 3 (three) times daily.   cholecalciferol 25 MCG (1000 UNIT) tablet Commonly known as: VITAMIN D Take 2,000 Units by mouth daily.   divalproex 125 MG capsule Commonly known as: DEPAKOTE SPRINKLE Take 125 mg by mouth 2 (two) times daily.   FLUoxetine 20 MG capsule Commonly known as: PROZAC Take 20 mg by mouth daily.   methylphenidate 5 MG tablet Commonly known as: RITALIN Take 5 mg by mouth 2 (two) times daily.   risperiDONE 1 MG tablet Commonly known as: RISPERDAL Take 1 mg by mouth at bedtime.   senna-docusate 8.6-50 MG tablet Commonly known as: Senokot-S Take 2 tablets by  mouth 2 (two) times daily.   tamsulosin 0.4 MG Caps capsule Commonly known as: FLOMAX Take 0.4 mg by mouth.   traZODone 100 MG tablet Commonly known as: DESYREL Take 100 mg by mouth at bedtime.   vitamin B-12 1000 MCG tablet Commonly known as: CYANOCOBALAMIN Take 1,000 mcg by mouth daily.               Discharge Care Instructions  (From admission, onward)           Start     Ordered   10/20/20 0000  Discharge wound care:       Comments: Wound care  Daily      Comments: Wound care to right forearm area of tissue trauma, deep tissue pressure injury:  Cleanse with NS, pat dry. Cover with xeroform gauze [employer] [place] # [id number]). Top with dry gauze, ABD pad and secure with a few turns of Kerlix roll gauze/paper tape. Change daily.  10/18/20 1411      10/18/20 1357    Wound care  Every shift      Comments: Wound care to bilateral LEs (including knees):  Cleanse with soap and water, rinse and pat dry. Apply Eucerin cream to LEs  and feet, but not between toes. Cover right lateral great toe partial thickness wound and bilateral knee eschars with xeroform gauze and top with dry gauze, secure with Kerlix roll gauze or silicone foam dressing. Place feet into [employer].  10/18/20 1403   10/20/20 1354            No Known Allergies  Consultations: Palliative care   Procedures/Studies: DG Chest 1 View  Result Date: 10/17/2020 CLINICAL DATA:  Un witnessed fall. EXAM: CHEST  1 VIEW COMPARISON:  10/16/20. FINDINGS: The heart size and mediastinal contours are within normal limits. Both lungs are clear. The visualized skeletal structures are unremarkable. Age-indeterminate left posterior 6 rib fracture is stable from previous exam. IMPRESSION: No active cardiopulmonary abnormalities. Electronically Signed   By: Signa Kell M.D.   On: 10/17/2020 10:08   DG Elbow 2 Views Left  Result Date: 10/17/2020 CLINICAL DATA:  Un witnessed fall. EXAM: LEFT ELBOW - 2 VIEW COMPARISON:  None.  FINDINGS: There is no evidence of fracture, dislocation, or joint effusion. There is no evidence of arthropathy or other focal bone abnormality. Soft tissues are unremarkable. IMPRESSION: Negative. Electronically Signed   By: Signa Kell M.D.   On: 10/17/2020 10:15   CT Head Wo Contrast  Result Date: 10/17/2020 CLINICAL DATA:  87 year old male found down. EXAM: CT HEAD WITHOUT CONTRAST TECHNIQUE: Contiguous axial images were obtained from the base of the skull through the vertex without intravenous contrast. COMPARISON:  Head and cervical spine CT yesterday. FINDINGS: Brain: Stable cerebral volume, with disproportionate mesial temporal lobe atrophy. Stable ventricle size and configuration. No superimposed No midline shift, mass effect, evidence of mass lesion, intracranial hemorrhage or evidence of cortically based acute infarction. Stable gray-white matter differentiation throughout the brain. Vascular: Calcified atherosclerosis at the skull base. No suspicious intracranial vascular hyperdensity. Skull: Stable and intact. Sinuses/Orbits: Mild to moderate ethmoid and maxillary sinus mucosal thickening is stable. Tympanic cavities and mastoids remain clear. Other: Broad-based left posterior convexity scalp hematoma is new on series 4, image 61. Underlying calvarium appears stable and intact. Other orbit and scalp soft tissues appear stable. IMPRESSION: 1. Left posterior convexity scalp hematoma without underlying skull fracture. 2. No acute intracranial abnormality, stable brain since yesterday. Electronically Signed   By: Odessa Fleming M.D.   On: 10/17/2020 09:17   CT Head Wo Contrast  Result Date: 10/16/2020 CLINICAL DATA:  Head trauma, minor (Age >= 65y) fall EXAM: CT HEAD WITHOUT CONTRAST TECHNIQUE: Contiguous axial images were obtained from the base of the skull through the vertex without intravenous contrast. COMPARISON:  None. FINDINGS: Brain: There is atrophy and chronic small vessel disease changes. No  acute intracranial abnormality. Specifically, no hemorrhage, hydrocephalus, mass lesion, acute infarction, or significant intracranial injury. Vascular: No hyperdense vessel or unexpected calcification. Skull: No acute calvarial abnormality. Sinuses/Orbits: No acute findings Other: None IMPRESSION: Atrophy, chronic microvascular disease. No acute intracranial abnormality. Electronically Signed   By: Charlett Nose M.D.   On: 10/16/2020 16:42   CT Cervical Spine Wo Contrast  Result Date: 10/17/2020 CLINICAL DATA:  87 year old male found down. EXAM: CT CERVICAL SPINE WITHOUT CONTRAST TECHNIQUE: Multidetector CT imaging of the cervical spine was performed without intravenous contrast. Multiplanar CT image reconstructions were also generated. COMPARISON:  CT head today.  CT head and cervical spine yesterday. FINDINGS: Alignment: Stable straightening of cervical lordosis. Cervicothoracic junction alignment is within normal limits. Bilateral posterior element alignment is within normal limits. Skull base and vertebrae: Central skull base appears stable and  intact. C1 and C2 appear stable and intact. Stable visualized osseous structures. Soft tissues and spinal canal: No prevertebral fluid or swelling. No visible canal hematoma. Small volume retained secretions in the hypopharynx. Otherwise negative noncontrast visible neck soft tissues. Disc levels: Bulky degenerative ligamentous hypertrophy about the odontoid with mild cervicomedullary junction spinal stenosis. Advanced cervical spine degeneration elsewhere, including bulky disc degeneration at C4-C5 with suspected mild to moderate spinal stenosis there, at C5-C6. Upper chest: Visible upper thoracic levels and lung apices are stable. IMPRESSION: 1. No acute traumatic injury identified in the cervical spine. 2. Stable advanced cervical spine degeneration with suspected degenerative spinal stenosis at the cervicomedullary junction, C4-C5, and C5-C6. Electronically Signed    By: Odessa Fleming M.D.   On: 10/17/2020 09:20   CT Cervical Spine Wo Contrast  Result Date: 10/16/2020 CLINICAL DATA:  Neck trauma (Age >= 65y).  Fall. EXAM: CT CERVICAL SPINE WITHOUT CONTRAST TECHNIQUE: Multidetector CT imaging of the cervical spine was performed without intravenous contrast. Multiplanar CT image reconstructions were also generated. COMPARISON:  None. FINDINGS: Alignment: No subluxation Skull base and vertebrae: No acute fracture. No primary bone lesion or focal pathologic process. Soft tissues and spinal canal: No prevertebral fluid or swelling. No visible canal hematoma. Disc levels: Diffuse degenerative disc disease, most pronounced from C5-6 through C7-T1. Moderate to advanced bilateral degenerative facet disease diffusely, left greater than right. Upper chest: No acute findings.  Biapical scarring. Other: None IMPRESSION: Degenerative disc and facet disease.  No acute bony abnormality. Electronically Signed   By: Charlett Nose M.D.   On: 10/16/2020 16:43   DG Chest Portable 1 View  Result Date: 10/16/2020 CLINICAL DATA:  Onset generalized weakness after lunch today. EXAM: PORTABLE CHEST 1 VIEW COMPARISON:  None. FINDINGS: There is mild elevation of the left hemidiaphragm relative to the right with some left basilar atelectasis. Lungs are otherwise clear. No pneumothorax or pleural fluid. Heart size is normal. The patient has a fracture of the posterior arc of the left sixth rib which is age indeterminate. IMPRESSION: No acute disease. Age indeterminate fracture left sixth rib. Electronically Signed   By: Drusilla Kanner M.D.   On: 10/16/2020 15:24   DG Shoulder Left  Result Date: 10/17/2020 CLINICAL DATA:  Fall. EXAM: LEFT SHOULDER - 2+ VIEW COMPARISON:  None FINDINGS: There is no evidence of fracture or dislocation. There is no evidence of arthropathy or other focal bone abnormality. Soft tissues are unremarkable. IMPRESSION: Negative. Electronically Signed   By: Signa Kell M.D.    On: 10/17/2020 10:09   DG Knee Complete 4 Views Left  Result Date: 10/17/2020 CLINICAL DATA:  Un witnessed fall. EXAM: LEFT KNEE - COMPLETE 4+ VIEW COMPARISON:  None. FINDINGS: No evidence of fracture, dislocation, or joint effusion. Moderate medial compartment osteoarthritis. Soft tissues are unremarkable. IMPRESSION: 1. No acute findings. 2. Moderate medial compartment osteoarthritis. Electronically Signed   By: Signa Kell M.D.   On: 10/17/2020 10:18   DG Knee Complete 4 Views Right  Result Date: 10/17/2020 CLINICAL DATA:  Un witnessed fall. EXAM: RIGHT KNEE - COMPLETE 4+ VIEW COMPARISON:  None. FINDINGS: No evidence of fracture, dislocation, or joint effusion. Mild tricompartment osteoarthritis. Soft tissues are unremarkable. IMPRESSION: 1. No acute findings. 2. Mild osteoarthritis. Electronically Signed   By: Signa Kell M.D.   On: 10/17/2020 10:16   CT Renal Stone Study  Result Date: 10/17/2020 CLINICAL DATA:  87 year old male with flank pain and altered mental status. UTI. EXAM: CT ABDOMEN AND PELVIS  WITHOUT CONTRAST TECHNIQUE: Multidetector CT imaging of the abdomen and pelvis was performed following the standard protocol without IV contrast. COMPARISON:  None. FINDINGS: Lower chest: Calcified coronary artery atherosclerosis. No pericardial or pleural effusion. Mild elevation of the left hemidiaphragm. Mild lung base atelectasis. Hepatobiliary: Cholelithiasis in the neck of the gallbladder on series 2, image 30. No pericholecystic inflammation. Negative noncontrast liver. Pancreas: Negative. Spleen: Negative. Adrenals/Urinary Tract: Normal adrenal glands. Difficult to exclude punctate nephrolithiasis in the left upper pole. But no other renal calculus identified. No hydronephrosis. No hydroureter. Evidence of a small left renal midpole cyst. Both ureters remain within normal limits to the bladder. Extensive small layering stones are present throughout the urinary bladder (series 2, image  74, ranging from punctate to 4 mm diameter. Mildly distended urinary bladder. No perivesical stranding. Stomach/Bowel: Normal appendix on series 2, image 49. The cecum does appear to beyond a lax mesentery. No dilated or inflamed bowel is evident. Stomach and duodenum are decompressed. No free air or free fluid. Vascular/Lymphatic: Extensive Aortoiliac calcified atherosclerosis. Normal caliber abdominal aorta. Vascular patency is not evaluated in the absence of IV contrast. No lymphadenopathy. Reproductive: Prostatomegaly. Other: No pelvic free fluid. Musculoskeletal: Advanced lumbar disc and endplate degeneration. Multilevel vacuum disc. No acute osseous abnormality identified. IMPRESSION: 1. Extensive gravel-like stones layering throughout the urinary bladder. But trace if any superimposed nephrolithiasis, and no acute obstructive uropathy. 2. Cholelithiasis without CT evidence of acute cholecystitis. 3. Calcified coronary artery and Aortic Atherosclerosis (ICD10-I70.0). 4. Advanced lumbar spine degeneration. Electronically Signed   By: Odessa Fleming M.D.   On: 10/17/2020 11:30   DG Hip Unilat W or Wo Pelvis 2-3 Views Left  Result Date: 10/17/2020 CLINICAL DATA:  Status post fall. EXAM: DG HIP (WITH OR WITHOUT PELVIS) 2-3V LEFT COMPARISON:  None FINDINGS: There is no evidence of hip fracture or dislocation. Mild left hip osteoarthritis. Degenerative disc disease noted within the visualized portions of the lumbar spine. IMPRESSION: 1. No acute findings. 2. Mild left hip osteoarthritis. Electronically Signed   By: Signa Kell M.D.   On: 10/17/2020 10:20   (Echo, Carotid, EGD, Colonoscopy, ERCP)    Subjective: Patient seen and examined on the day of discharge.  Sitting up in bed eating.  Calm.  Answers simple questions.  Discharge Exam: Vitals:   10/20/20 0759 10/20/20 1203  BP: (!) 166/74 (!) 150/79  Pulse: 68 67  Resp: 16 16  Temp: 98 F (36.7 C) 98.1 F (36.7 C)  SpO2: 97% 98%   Vitals:    10/20/20 0007 10/20/20 0500 10/20/20 0759 10/20/20 1203  BP: (!) 148/94  (!) 166/74 (!) 150/79  Pulse: 68  68 67  Resp: Temp: 98 F (36.7 C)  98 F (36.7 C) 98.1 F (36.7 C)  TempSrc:    Oral  SpO2: 91%  97% 98%  Weight:  50 kg    Height:        General: Patient is awake.  Not in any distress.  Appears frail Cardiovascular: RRR, S1/S2 +, no rubs, no gallops Respiratory: Decreased at bases.  Normal work of breathing.  Room air Abdominal: Soft, NT, ND, bowel sounds + Extremities: no edema, no cyanosis    The results of significant diagnostics from this hospitalization (including imaging, microbiology, ancillary and laboratory) are listed below for reference.     Microbiology: Recent Results (from the past 240 hour(s))  Urine Culture     Status: Abnormal   Collection Time: 10/16/20  3:49  PM   Specimen: Urine, Random  Result Value Ref Range Status   Specimen Description   Final    URINE, RANDOM Performed at [facility], [address] [place], [place] [address]    Special Requests   Final    NONE Performed at [facility], [address] [place], [place] [address]    Culture >=100,000 COLONIES/mL STAPHYLOCOCCUS EPIDERMIDIS (A)  Final   Report Status 10/19/2020 FINAL  Final   Organism ID, Bacteria STAPHYLOCOCCUS EPIDERMIDIS (A)  Final      Susceptibility   Staphylococcus epidermidis - MIC*    CIPROFLOXACIN <=0.5 SENSITIVE Sensitive     GENTAMICIN <=0.5 SENSITIVE Sensitive     NITROFURANTOIN 32 SENSITIVE Sensitive     OXACILLIN <=0.25 SENSITIVE Sensitive     TETRACYCLINE <=1 SENSITIVE Sensitive     VANCOMYCIN 1 SENSITIVE Sensitive     TRIMETH/SULFA <=10 SENSITIVE Sensitive     CLINDAMYCIN <=0.25 SENSITIVE Sensitive     RIFAMPIN <=0.5 SENSITIVE Sensitive     Inducible Clindamycin NEGATIVE Sensitive     * >=100,000 COLONIES/mL STAPHYLOCOCCUS EPIDERMIDIS  Urine Culture     Status: Abnormal   Collection Time: 10/17/20  8:21 AM    Specimen: Urine, Clean Catch  Result Value Ref Range Status   Specimen Description   Final    URINE, CLEAN CATCH Performed at [facility], [address], [place], [place] [address]    Special Requests   Final    NONE Performed at [facility], [address], [place], [place] [address]    Culture MULTIPLE SPECIES PRESENT, SUGGEST RECOLLECTION (A)  Final   Report Status 10/18/2020 FINAL  Final  Resp Panel by RT-PCR (Flu A&B, Covid) Nasopharyngeal Swab     Status: None   Collection Time: 10/17/20 10:53 AM   Specimen: Nasopharyngeal Swab; Nasopharyngeal(NP) swabs in vial transport medium  Result Value Ref Range Status   SARS Coronavirus 2 by RT PCR NEGATIVE NEGATIVE Final    Comment: (NOTE) SARS-CoV-2 target nucleic acids are NOT DETECTED.  The SARS-CoV-2 RNA is generally detectable in upper respiratory specimens during the acute phase of infection. The lowest concentration of SARS-CoV-2 viral copies this assay can detect is 138 copies/mL. A negative result does not preclude SARS-Cov-2 infection and should not be used as the sole basis for treatment or other patient management decisions. A negative result may occur with  improper specimen collection/handling, submission of specimen other than nasopharyngeal swab, presence of viral mutation(s) within the areas targeted by this assay, and inadequate number of viral copies(<138 copies/mL). A negative result must be combined with clinical observations, patient history, and epidemiological information. The expected result is Negative.  Fact Sheet for Patients:  BloggerCourse.com  Fact Sheet for Healthcare Providers:  SeriousBroker.it  This test is no t yet approved or cleared by the [place] FDA and  has been authorized for detection and/or diagnosis of SARS-CoV-2 by FDA under an Emergency Use Authorization (EUA). This EUA will remain  in effect (meaning  this test can be used) for the duration of the COVID-19 declaration under Section 564(b)(1) of the Act, 21 U.S.C.section 360bbb-3(b)(1), unless the authorization is terminated  or revoked sooner.       Influenza A by PCR NEGATIVE NEGATIVE Final   Influenza B by PCR NEGATIVE NEGATIVE Final    Comment: (NOTE) The Xpert Xpress SARS-CoV-2/FLU/RSV plus assay is intended as an aid in the diagnosis of influenza from Nasopharyngeal swab specimens and should not be used  as a sole basis for treatment. Nasal washings and aspirates are unacceptable for Xpert Xpress SARS-CoV-2/FLU/RSV testing.  Fact Sheet for Patients: BloggerCourse.com  Fact Sheet for Healthcare Providers: SeriousBroker.it  This test is not yet approved or cleared by the [place] FDA and has been authorized for detection and/or diagnosis of SARS-CoV-2 by FDA under an Emergency Use Authorization (EUA). This EUA will remain in effect (meaning this test can be used) for the duration of the COVID-19 declaration under Section 564(b)(1) of the Act, 21 U.S.C. section 360bbb-3(b)(1), unless the authorization is terminated or revoked.  Performed at [facility], [address] [place], [place] [address]      Labs: BNP (last 3 results) No results for input(s): BNP in the last 8760 hours. Basic Metabolic Panel: Recent Labs  Lab 10/16/20 1324 10/17/20 0821 10/18/20 0555  NA 141 143 142  K 3.9 3.9 3.9  CL 106 103 106  CO2 25 26 30   GLUCOSE 122* 128* 76  BUN 23 31* 37*  CREATININE 1.03 1.07 0.90  CALCIUM 9.5 10.0 8.9  MG  --  2.1  --    Liver Function Tests: Recent Labs  Lab 10/17/20 0821  AST 117*  ALT 37  ALKPHOS 39  BILITOT 1.4*  PROT 6.4*  ALBUMIN 3.9   No results for input(s): LIPASE, AMYLASE in the last 168 hours. No results for input(s): AMMONIA in the last 168 hours. CBC: Recent Labs  Lab 10/16/20 1324 10/17/20 0821 10/17/20 2226  10/18/20 0555  WBC 9.4 13.5* 10.8* 9.0  NEUTROABS  --  11.4*  --   --   HGB 13.5 13.5 12.7* 12.6*  HCT 40.0 40.8 38.2* 37.5*  MCV 97.3 98.3 100.3* 100.5*  PLT 153 147* 131* 127*   Cardiac Enzymes: Recent Labs  Lab 10/17/20 0821  CKTOTAL 4,562*   BNP: Invalid input(s): POCBNP CBG: No results for input(s): GLUCAP in the last 168 hours. D-Dimer No results for input(s): DDIMER in the last 72 hours. Hgb A1c No results for input(s): HGBA1C in the last 72 hours. Lipid Profile No results for input(s): CHOL, HDL, LDLCALC, TRIG, CHOLHDL, LDLDIRECT in the last 72 hours. Thyroid function studies Recent Labs    10/17/20 1553  TSH 1.498   Anemia work up No results for input(s): VITAMINB12, FOLATE, FERRITIN, TIBC, IRON, RETICCTPCT in the last 72 hours. Urinalysis    Component Value Date/Time   COLORURINE YELLOW (A) 10/17/2020 0821   APPEARANCEUR HAZY (A) 10/17/2020 0821   LABSPEC 1.024 10/17/2020 0821   PHURINE 6.0 10/17/2020 0821   GLUCOSEU NEGATIVE 10/17/2020 0821   HGBUR LARGE (A) 10/17/2020 0821   BILIRUBINUR NEGATIVE 10/17/2020 0821   KETONESUR 20 (A) 10/17/2020 0821   PROTEINUR 30 (A) 10/17/2020 0821   NITRITE POSITIVE (A) 10/17/2020 0821   LEUKOCYTESUR MODERATE (A) 10/17/2020 0821   Sepsis Labs Invalid input(s): PROCALCITONIN,  WBC,  LACTICIDVEN Microbiology Recent Results (from the past 240 hour(s))  Urine Culture     Status: Abnormal   Collection Time: 10/16/20  3:49 PM   Specimen: Urine, Random  Result Value Ref Range Status   Specimen Description   Final    URINE, RANDOM Performed at [facility], [address] [place], [place]    Special Requests   Final    NONE Performed at [facility], [address], [place], [place]    Culture >=100,000 COLONIES/mL STAPHYLOCOCCUS EPIDERMIDIS (A)  Final   Report Status 10/19/2020 FINAL  Final  Organism ID, Bacteria STAPHYLOCOCCUS EPIDERMIDIS (A)  Final      Susceptibility    Staphylococcus epidermidis - MIC*    CIPROFLOXACIN <=0.5 SENSITIVE Sensitive     GENTAMICIN <=0.5 SENSITIVE Sensitive     NITROFURANTOIN 32 SENSITIVE Sensitive     OXACILLIN <=0.25 SENSITIVE Sensitive     TETRACYCLINE <=1 SENSITIVE Sensitive     VANCOMYCIN 1 SENSITIVE Sensitive     TRIMETH/SULFA <=10 SENSITIVE Sensitive     CLINDAMYCIN <=0.25 SENSITIVE Sensitive     RIFAMPIN <=0.5 SENSITIVE Sensitive     Inducible Clindamycin NEGATIVE Sensitive     * >=100,000 COLONIES/mL STAPHYLOCOCCUS EPIDERMIDIS  Urine Culture     Status: Abnormal   Collection Time: 10/17/20  8:21 AM   Specimen: Urine, Clean Catch  Result Value Ref Range Status   Specimen Description   Final    URINE, CLEAN CATCH Performed at [facility], [address] [place], [place] [address]    Special Requests   Final    NONE Performed at [facility], [address] [place], [place] [address]    Culture MULTIPLE SPECIES PRESENT, SUGGEST RECOLLECTION (A)  Final   Report Status 10/18/2020 FINAL  Final  Resp Panel by RT-PCR (Flu A&B, Covid) Nasopharyngeal Swab     Status: None   Collection Time: 10/17/20 10:53 AM   Specimen: Nasopharyngeal Swab; Nasopharyngeal(NP) swabs in vial transport medium  Result Value Ref Range Status   SARS Coronavirus 2 by RT PCR NEGATIVE NEGATIVE Final    Comment: (NOTE) SARS-CoV-2 target nucleic acids are NOT DETECTED.  The SARS-CoV-2 RNA is generally detectable in upper respiratory specimens during the acute phase of infection. The lowest concentration of SARS-CoV-2 viral copies this assay can detect is 138 copies/mL. A negative result does not preclude SARS-Cov-2 infection and should not be used as the sole basis for treatment or other patient management decisions. A negative result may occur with  improper specimen collection/handling, submission of specimen other than nasopharyngeal swab, presence of viral mutation(s) within the areas targeted by this assay,  and inadequate number of viral copies(<138 copies/mL). A negative result must be combined with clinical observations, patient history, and epidemiological information. The expected result is Negative.  Fact Sheet for Patients:  BloggerCourse.comhttps://www.fda.gov/media/152166/download  Fact Sheet for Healthcare Providers:  SeriousBroker.ithttps://www.fda.gov/media/152162/download  This test is no t yet approved or cleared by the [place] FDA and  has been authorized for detection and/or diagnosis of SARS-CoV-2 by FDA under an Emergency Use Authorization (EUA). This EUA will remain  in effect (meaning this test can be used) for the duration of the COVID-19 declaration under Section 564(b)(1) of the Act, 21 U.S.C.section 360bbb-3(b)(1), unless the authorization is terminated  or revoked sooner.       Influenza A by PCR NEGATIVE NEGATIVE Final   Influenza B by PCR NEGATIVE NEGATIVE Final    Comment: (NOTE) The Xpert Xpress SARS-CoV-2/FLU/RSV plus assay is intended as an aid in the diagnosis of influenza from Nasopharyngeal swab specimens and should not be used as a sole basis for treatment. Nasal washings and aspirates are unacceptable for Xpert Xpress SARS-CoV-2/FLU/RSV testing.  Fact Sheet for Patients: BloggerCourse.comhttps://www.fda.gov/media/152166/download  Fact Sheet for Healthcare Providers: SeriousBroker.ithttps://www.fda.gov/media/152162/download  This test is not yet approved or cleared by the [place] FDA and has been authorized for detection and/or diagnosis of SARS-CoV-2 by FDA under an Emergency Use Authorization (EUA). This EUA will remain in effect (meaning this test can be used) for the duration of the COVID-19 declaration  under Section 564(b)(1) of the Act, 21 U.S.C. section 360bbb-3(b)(1), unless the authorization is terminated or revoked.  Performed at [facility], [address], [place], [place] [address]      Time coordinating discharge: Over 30 minutes  SIGNED:   Tresa Moore,  MD  [facility] 10/20/2020, 1:54 PM Pager   If 7PM-7AM, please contact night-coverage

## 2020-10-20 NOTE — ED Notes (Signed)
Pt placed on bed alarm at this time.

## 2020-10-20 NOTE — ED Notes (Signed)
Pt attempted to get out of bed. Pt assisted back in bed via x2RNS.

## 2020-10-20 NOTE — TOC Progression Note (Signed)
Transition of Care [facility]) - Progression Note    Patient Details  Name: Quandarius Nill MRN: [id number] Date of Birth: 1929-08-13  Transition of Care [facility]) CM/SW Contact  Barrie Dunker, RN Phone Number: 10/20/2020, 11:16 AM  Clinical Narrative:     Reached out to [facility] to see if they are going to take the patient back, awaiting a call back       Expected Discharge Plan and Services                                                 Social Determinants of Health (SDOH) Interventions    Readmission Risk Interventions No flowsheet data found.

## 2020-10-20 NOTE — ED Triage Notes (Addendum)
Pt presents to the ER from [facility] s/p a fall. Per EMS, fall was unwitnessed, unknown LOC. Pt recently discharged from hospital. AO baseline. Pt able to verbalize name and DOB. Pt not on blood thinners.

## 2020-10-20 NOTE — ED Notes (Signed)
Bed alarm heard. Pt attempting to get out of bed. Pt assisted back in bed via x2 RNS

## 2020-10-20 NOTE — ED Notes (Signed)
Patient transported to CT 

## 2020-10-20 NOTE — ED Notes (Signed)
Spoke with April from [facility] to verify if pt received home medications prior to arrival to the ER. Per April, pt has not received home nightly medication. Will proceed with administering nightly medication.

## 2020-10-20 NOTE — ED Provider Notes (Signed)
[facility] Emergency Department Provider Note  ____________________________________________   Event Date/Time   First MD Initiated Contact with Patient 10/20/20 1912     (approximate)  I have reviewed the triage vital signs and the nursing notes.   HISTORY  Chief Complaint Fall    HPI Scott Montgomery is a 87 y.o. male with dementia who comes in from [facility] for a fall.  Patient was discharged from the hospital to [facility].  Upon arrival with a 1 hour patient had  unwitnessed fall.  Patient is alert and oriented x1 denies any pain.  Moving all extremities.  On review of records I actually saw this patient on 7/30 admitted him to the hospital for concerns for UTI failing outpatient treatment.  On review of discharge summary it sounds like urine culture showed multiple species.  Patient was referred to palliative care.  It was felt that his mental status changes are from his underlying dementia.  Patient was discharged back to his assisted living facility.  The discharge note states that family was interested in potential higher level of care as but it does not appear that patient was seen by PT or OT.  Unable to get full HPI from patient due to baseline dementia          Past Medical History:  Diagnosis Date   Dementia [facility])    Hypertension     Patient Active Problem List   Diagnosis Date Noted   Protein-calorie malnutrition, severe 10/19/2020   Acute metabolic encephalopathy 10/18/2020   Fall 10/17/2020   Dementia (HCC) 10/17/2020   Essential hypertension 10/17/2020   Elevated troponin 10/17/2020   Acute lower UTI 10/17/2020   History of seizure 10/17/2020   BPH (benign prostatic hyperplasia) 10/17/2020   Confusion 10/17/2020   Dementia with behavioral disturbance (HCC) 10/17/2020    No past surgical history on file.  Prior to Admission medications   Medication Sig Start Date End Date Taking? Authorizing Provider   acetaminophen (TYLENOL) 325 MG tablet Take 650 mg by mouth 3 (three) times daily.    [provider]  cholecalciferol (VITAMIN D) 25 MCG (1000 UNIT) tablet Take 2,000 Units by mouth daily.    [provider]  divalproex (DEPAKOTE SPRINKLE) 125 MG capsule Take 125 mg by mouth 2 (two) times daily.    [provider]  FLUoxetine (PROZAC) 20 MG capsule Take 20 mg by mouth daily.    [provider]  methylphenidate (RITALIN) 5 MG tablet Take 5 mg by mouth 2 (two) times daily.    [provider]  risperiDONE (RISPERDAL) 1 MG tablet Take 1 mg by mouth at bedtime.    [provider]  senna-docusate (SENOKOT-S) 8.6-50 MG tablet Take 2 tablets by mouth 2 (two) times daily.    [provider]  tamsulosin (FLOMAX) 0.4 MG CAPS capsule Take 0.4 mg by mouth.    [provider]  traZODone (DESYREL) 100 MG tablet Take 100 mg by mouth at bedtime.    [provider]  vitamin B-12 (CYANOCOBALAMIN) 1000 MCG tablet Take 1,000 mcg by mouth daily.    [provider]    Allergies Patient has no known allergies.  No family history on file.  Social History Social History   Tobacco Use   Smoking status: Unknown  Vaping Use   Vaping Use: Unknown  Substance Use Topics   Alcohol use: Not Currently   Drug use: Never      Review of  Systems Unable to get full review of system from patient due to baseline dementia _______________________   PHYSICAL EXAM:  VITAL SIGNS: Blood pressure (!) 141/66, pulse 67, temperature 99.3 F (37.4 C), temperature source Oral, resp. rate 18, height 5\' 5"  (1.651 m), weight 50 kg, SpO2 94 %.   Constitutional: Alert and oriented x1.  Eyes: Conjunctivae are normal. EOMI. Head: Abrasion on forehead Nose: No congestion/rhinnorhea. Mouth/Throat: Mucous membranes are moist.   Neck: No stridor. Trachea Midline. FROM Cardiovascular: Normal rate, regular rhythm. Grossly normal heart sounds.   Good peripheral circulation. No chest wall tenderness Respiratory: Normal respiratory effort.  No retractions. Lungs CTAB. Gastrointestinal: Soft and nontender. No distention. No abdominal bruits.  Musculoskeletal:   RUE: No point tenderness, deformity or other signs of injury. Radial pulse intact. Neuro intact. Full ROM in joint. LUE: No point tenderness, deformity or other signs of injury. Radial pulse intact. Neuro intact. Full ROM in joints RLE: No point tenderness, deformity or other signs of injury. DP pulse intact. Neuro intact. Full ROM in joints. LLE: No point tenderness, deformity or other signs of injury. DP pulse intact. Neuro intact. Full ROM in joints. Neurologic:  Normal speech and language. No gross focal neurologic deficits are appreciated.  Skin:  Skin is warm, dry and intact. No rash noted. Psychiatric: Mood and affect are normal. Speech and behavior are normal. GU: Deferred   ____________________________________________   LABS (all labs ordered are listed, but only abnormal results are displayed)  Labs Reviewed  CBC WITH DIFFERENTIAL/PLATELET - Abnormal; Notable for the following components:      Result Value   RBC 4.05 (*)    Platelets 143 (*)    Monocytes Absolute 1.1 (*)    All other components within normal limits  COMPREHENSIVE METABOLIC PANEL - Abnormal; Notable for the following components:   Glucose, Bld 123 (*)    BUN 27 (*)    Albumin 3.3 (*)    AST 131 (*)    ALT 67 (*)    All other components within normal limits  RESP PANEL BY RT-PCR (FLU A&B, COVID) [id number]   ____________________________________________ RADIOLOGY Vela ProseI, Beadie Matsunaga E Sage Hammill, personally viewed and evaluated these images (plain radiographs) as part of my medical decision making, as well as reviewing the written report by the radiologist.  ED MD interpretation: No pneumonia, unchanged rib fracture  Official radiology report(s): CT HEAD WO CONTRAST (5MM)  Result Date: 10/20/2020 CLINICAL  DATA:  Head trauma, minor (Age >= 65y) Unwitnessed fall.  Unknown loss of consciousness. EXAM: CT HEAD WITHOUT CONTRAST TECHNIQUE: Contiguous axial images were obtained from the base of the skull through the vertex without intravenous contrast. COMPARISON:  Head CT 3 days ago 10/17/2020 FINDINGS: Brain: No intracranial hemorrhage, mass effect, or midline shift. Stable degree of atrophy and chronic small vessel ischemia. No hydrocephalus. The basilar cisterns are patent. No evidence of territorial infarct or acute ischemia. No extra-axial or intracranial fluid collection. Vascular: Atherosclerosis of skullbase vasculature without hyperdense vessel or abnormal calcification. Skull: No fracture or focal lesion. Sinuses/Orbits: Improved ethmoid air cell mucosal thickening from prior. No acute findings. Mastoid air cells are clear. Bilateral cataract resection. Other: Improving left posterior scalp hematoma. IMPRESSION: 1. No acute intracranial abnormality. No skull fracture. 2. Stable atrophy and chronic small vessel ischemia. Electronically Signed   By: Narda RutherfordMelanie  Sanford M.D.   On: 10/20/2020 19:54   CT Cervical Spine Wo Contrast  Result Date: 10/20/2020 CLINICAL DATA:  Neck trauma (Age >= 65y) Unwitnessed fall. EXAM:  CT CERVICAL SPINE WITHOUT CONTRAST TECHNIQUE: Multidetector CT imaging of the cervical spine was performed without intravenous contrast. Multiplanar CT image reconstructions were also generated. COMPARISON:  Cervical spine CT 3 days ago 10/17/2020 FINDINGS: Alignment: Straightening of normal lordosis. No traumatic subluxation. Skull base and vertebrae: No acute fracture. Vertebral body heights are maintained. The dens and skull base are intact. Stable degenerative pannus at C1-C2. Soft tissues and spinal canal: No prevertebral fluid or swelling. No visible canal hematoma. Disc levels: Diffuse degenerative disc disease and facet hypertrophy. No significant change from prior exam. Upper chest: Biapical  pleuroparenchymal scarring. No acute findings. Other: None. IMPRESSION: 1. No acute fracture or subluxation of the cervical spine. 2. Multilevel degenerative disc disease and facet hypertrophy. Electronically Signed   By: Narda Rutherford M.D.   On: 10/20/2020 20:00   DG Chest Portable 1 View  Result Date: 10/20/2020 CLINICAL DATA:  Unwitnessed fall. EXAM: PORTABLE CHEST 1 VIEW COMPARISON:  Radiograph 10/17/2020 FINDINGS: Stable heart size and mediastinal contours. No pneumothorax or pleural effusion. Stable elevation of left hemidiaphragm. No focal airspace disease. No pulmonary edema. Unchanged left posterior sixth rib fracture. No acute osseous abnormalities are seen. IMPRESSION: 1. No acute chest findings. 2. Unchanged left posterior sixth rib fracture. Electronically Signed   By: Narda Rutherford M.D.   On: 10/20/2020 19:39    ____________________________________________   PROCEDURES  Procedure(s) performed (including Critical Care):  Procedures   ____________________________________________   INITIAL IMPRESSION / ASSESSMENT AND PLAN / ED COURSE    Scott Montgomery was evaluated in Emergency Department on 10/20/2020 for the symptoms described in the history of present illness. He was evaluated in the context of the global COVID-19 pandemic, which necessitated consideration that the patient might be at risk for infection with the SARS-CoV-2 virus that causes COVID-19. Institutional protocols and algorithms that pertain to the evaluation of patients at risk for COVID-19 are in a state of rapid change based on information released by regulatory bodies including the CDC and federal and state organizations. These policies and algorithms were followed during the patient's care in the ED.    Patient comes in for unwitnessed fall.  Consider intracranial hemorrhage, cervical fracture.  No chest wall tenderness abdominal tenderness.  Patient able to move all his extremities and lift up both  of his legs up off the bed and move his arms therefore low suspicion for fracture.  He denies any tenderness anywhere.  Patient initially had a low-grade temperature but upon repeat is downtrending.  Will get chest x-ray to make sure no pneumonia, repeat labs and COVID swab to evaluate for COVID.  He just had urine culture during his admission that did not show any signs of UTI and he was just given multiple doses of IV ceftriaxone.  Do not think we need to retest at this time unless he starts developing over 38 C.  COVID-negative, chest x-ray is negative, white count is not elevated.  Unable to get a hold of the wife but did talk to the family friend listed in the contacts who I talked to previously when I had seen the patient.  She states that she was told that the patient was supposed to have PT and OT see them for potential higher level care.  She understandably does not want patient go back to the facility if he meets additional care.  She states that they were not aware the patient was going to go back to the facility and is now worried that he  is not getting enough care there if he already had a fall within 1 hour of being back.  Given this we will place social work consult PT OT.  ____________________________________________   FINAL CLINICAL IMPRESSION(S) / ED DIAGNOSES   Final diagnoses:  Fall, initial encounter  Dementia without behavioral disturbance, unspecified dementia type (HCC)      MEDICATIONS GIVEN DURING THIS VISIT:  Medications  acetaminophen (TYLENOL) tablet 650 mg (650 mg Oral Not Given 10/20/20 2336)  cholecalciferol (VITAMIN D) tablet 2,000 Units (has no administration in time range)  divalproex (DEPAKOTE SPRINKLE) capsule 125 mg (has no administration in time range)  FLUoxetine (PROZAC) capsule 20 mg (has no administration in time range)  methylphenidate (RITALIN) tablet 5 mg (has no administration in time range)  risperiDONE (RISPERDAL) tablet 1 mg (has no  administration in time range)  senna-docusate (Senokot-S) tablet 2 tablet (has no administration in time range)  traZODone (DESYREL) tablet 100 mg (has no administration in time range)  vitamin B-12 (CYANOCOBALAMIN) tablet 1,000 mcg (has no administration in time range)  tamsulosin (FLOMAX) capsule 0.4 mg (has no administration in time range)  Tdap (BOOSTRIX) injection 0.5 mL (0.5 mLs Intramuscular Given 10/20/20 1933)  acetaminophen (TYLENOL) tablet 1,000 mg (1,000 mg Oral Given 10/20/20 1932)     ED Discharge Orders     None        Note:  This document was prepared using Dragon voice recognition software and may include unintentional dictation errors.    Concha Se, MD 10/21/20 [phone]

## 2020-10-20 NOTE — ED Notes (Signed)
ED Provider at bedside. 

## 2020-10-20 NOTE — [place] FL2 (Signed)
[place] MEDICAID FL2 LEVEL OF CARE SCREENING TOOL     IDENTIFICATION  Patient Name: Scott Montgomery Birthdate: 15-Oct-1929 Sex: male Admission Date (Current Location): 10/17/2020  [facility] and [place] Number:      Facility and Address:  [facility], [address], [place], [place] [address]      Provider Number: [id number]  Attending Physician Name and Address:  Tresa Moore, MD  Relative Name and Phone Number:  Johnny Bridge [phone]    Current Level of Care: Hospital Recommended Level of Care: Assisted Living Facility Prior Approval Number:    Date Approved/Denied:   PASRR Number:    Discharge Plan:  (ALF)    Current Diagnoses: Patient Active Problem List   Diagnosis Date Noted   Protein-calorie malnutrition, severe 10/19/2020   Acute metabolic encephalopathy 10/18/2020   Fall 10/17/2020   Dementia (HCC) 10/17/2020   Essential hypertension 10/17/2020   Elevated troponin 10/17/2020   Acute lower UTI 10/17/2020   History of seizure 10/17/2020   BPH (benign prostatic hyperplasia) 10/17/2020   Confusion 10/17/2020   Dementia with behavioral disturbance (HCC) 10/17/2020    Orientation RESPIRATION BLADDER Height & Weight     Self  Normal Continent Weight: 50 kg Height:  5\' 5"  (165.1 cm)  BEHAVIORAL SYMPTOMS/MOOD NEUROLOGICAL BOWEL NUTRITION STATUS      Continent Diet (regular)  AMBULATORY STATUS COMMUNICATION OF NEEDS Skin   Supervision Verbally Normal                       Personal Care Assistance Level of Assistance  Bathing, Feeding, Dressing Bathing Assistance: Limited assistance Feeding assistance: Independent Dressing Assistance: Independent     Functional Limitations Info  Hearing   Hearing Info: Impaired      SPECIAL CARE FACTORS FREQUENCY                       Contractures Contractures Info: Not present    Additional Factors Info  Code Status, Allergies Code Status Info:  DNR Allergies Info: NKDA           Current Medications (10/20/2020):  This is the current hospital active medication list Current Facility-Administered Medications  Medication Dose Route Frequency Provider Last Rate Last Admin   acetaminophen (TYLENOL) tablet 650 mg  650 mg Oral Q6H PRN Cox, Amy N, DO       Or   acetaminophen (TYLENOL) suppository 650 mg  650 mg Rectal Q6H PRN Cox, Amy N, DO   650 mg at 10/17/20 1550   bisacodyl (DULCOLAX) suppository 10 mg  10 mg Rectal Daily PRN 10/19/20 B, MD       divalproex (DEPAKOTE SPRINKLE) capsule 125 mg  125 mg Oral BID Cox, Amy N, DO   125 mg at 10/20/20 1011   enoxaparin (LOVENOX) injection 40 mg  40 mg Subcutaneous Q24H Sreenath, Sudheer B, MD   40 mg at 10/19/20 1335   feeding supplement (NEPRO CARB STEADY) liquid 237 mL  237 mL Oral BID BM Sreenath, Sudheer B, MD   237 mL at 10/20/20 1014   FLUoxetine (PROZAC) capsule 20 mg  20 mg Oral Daily Cox, Amy N, DO   20 mg at 10/20/20 1011   hydrocerin (EUCERIN) cream   Topical BID 12/20/20 B, MD   Given at 10/20/20 1014   LORazepam (ATIVAN) injection 1 mg  1 mg Intravenous Q4H PRN Cox, Amy N, DO   1 mg at 10/17/20 1550  multivitamin with minerals tablet 1 tablet  1 tablet Oral Daily Lolita Patella B, MD   1 tablet at 10/20/20 1011   ondansetron (ZOFRAN) tablet 4 mg  4 mg Oral Q6H PRN Cox, Amy N, DO       Or   ondansetron (ZOFRAN) injection 4 mg  4 mg Intravenous Q6H PRN Cox, Amy N, DO       risperiDONE (RISPERDAL) tablet 1 mg  1 mg Oral QHS Cox, Amy N, DO   1 mg at 10/19/20 2050   tamsulosin (FLOMAX) capsule 0.4 mg  0.4 mg Oral Daily Cox, Amy N, DO   0.4 mg at 10/20/20 1011   traZODone (DESYREL) tablet 50 mg  50 mg Oral QHS Lolita Patella B, MD   50 mg at 10/19/20 2050     Discharge Medications: Please see discharge summary for a list of discharge medications.  Relevant Imaging Results:  Relevant Lab Results:   Additional Information [id number]  Barrie Dunker, RN

## 2020-10-21 NOTE — TOC Progression Note (Addendum)
Transition of Care [place]) - Progression Note    Patient Details  Name: Scott Montgomery MRN: [id number] Date of Birth: 1929-08-09  [facility]) CM/SW Contact  Reform Cellar, RN Phone Number: 10/21/2020, 5:07 PM  Clinical Narrative:    Sherron Monday to daughter, Archer Asa @ [phone] confirmed patient was not at psych unit but under medical care for 3 months. Family is wanting patient to discharge to memory care/LTC facility and not return to [facility] if possible. Payment for [facility] is done with assistance from [place]. [facility] and [facility]ie Clos who can be reached at [phone] ext [phone].   TOC will outreach to [employer] [facility] for resources available through [place] benefit.    Expected Discharge Plan: Memory Care Barriers to Discharge: ED Patient Insisting on an Alternate Living Situation/Facility  Expected Discharge Plan and Services Expected Discharge Plan: Memory Care       Living arrangements for the past 2 months: Assisted Living Facility                                       Social Determinants of Health (SDOH) Interventions    Readmission Risk Interventions No flowsheet data found.

## 2020-10-21 NOTE — Progress Notes (Signed)
PT Cancellation Note  Patient Details Name: Scott Montgomery MRN: [id number] DOB: 02-07-1930   Cancelled Treatment:    Reason Eval/Treat Not Completed: Patient's level of consciousness Chart reviewed, attempted to see pt.  Called name and shoulder/sternal rub.  Pt never opened eyes, did not voice anything pulled sheets over head, PT attempted to pull back down and re-engage, again pulled up covers w/o opening eyes.  Will try back later as appropriate.   Malachi Pro, DPT 10/21/2020, 10:54 AM

## 2020-10-21 NOTE — ED Notes (Signed)
Pt continues to rest, RR even and unlabored. Will administer meds upon awakening

## 2020-10-21 NOTE — ED Notes (Signed)
Pt refusing PT and OT at this time d/t sleeping. Will attempt med administration when pt wakes up

## 2020-10-21 NOTE — ED Provider Notes (Signed)
-----------------------------------------   5:18 AM on 10/21/2020 -----------------------------------------   Blood pressure (!) 141/66, pulse 67, temperature 99.3 F (37.4 C), temperature source Oral, resp. rate 18, height 1.651 m (5\' 5" ), weight 50 kg, SpO2 94 %.  The patient is calm and cooperative at this time.  There have been no acute events since the last update.  Awaiting disposition plan from [facility] team in the setting of recent hospitalization and discharge with immediate bounce back to the emergency department due to the patient requiring a higher level of care.   [facility], MD 10/21/20 [phone]

## 2020-10-21 NOTE — TOC Initial Note (Addendum)
Transition of Care [facility]) - Initial/Assessment Note    Patient Details  Name: Scott Montgomery MRN: [id number] Date of Birth: 1930-02-12  Transition of [facility]) CM/SW Contact:    Oxford Cellar, RN Phone Number: 10/21/2020, 4:46 PM  Clinical Narrative:                 Spoke with Buck,Betty @ [phone] who confirmed patient is needing placement into memory care unit. [facility]. Spouse is concerned patient is no longer appropriate for ALF but family is open to all placement options. Spouse reports patient is well known for calling 911 from his hospital room and for reporting unwitnessed falls.   Doubtful patient will be able to participate in therapy due to cognition but will wait for final decision from PT eval.   Spouse requested calls be sent to her daughter-Martha for more detailed answers.        Patient Goals and CMS Choice        Expected Discharge Plan and Services                                                Prior Living Arrangements/Services                       Activities of Daily Living      Permission Sought/Granted                  Emotional Assessment              Admission diagnosis:  Chest Pain  EMS Patient Active Problem List   Diagnosis Date Noted   Protein-calorie malnutrition, severe 10/19/2020   Acute metabolic encephalopathy 10/18/2020   Fall 10/17/2020   Dementia (HCC) 10/17/2020   Essential hypertension 10/17/2020   Elevated troponin 10/17/2020   Acute lower UTI 10/17/2020   History of seizure 10/17/2020   BPH (benign prostatic hyperplasia) 10/17/2020   Confusion 10/17/2020   Dementia with behavioral disturbance (HCC) 10/17/2020   PCP:  [facility]   [facility], [place] - [address] AT [facility] [address] [place] [address] Phone: [phone]  Fax: [phone]     Social Determinants of Health (SDOH) Interventions    Readmission Risk Interventions No flowsheet data found.

## 2020-10-21 NOTE — ED Notes (Signed)
Pt resting, RR even and unlabored, bed locked in lowest position, bed alarm in place

## 2020-10-21 NOTE — ED Notes (Signed)
Patient remains asleep at this time refusing PO intake.

## 2020-10-21 NOTE — Progress Notes (Signed)
PT Cancellation Note  Patient Details Name: Scott Montgomery MRN: [id number] DOB: 1930-02-28   Cancelled Treatment:    Reason Eval/Treat Not Completed: Patient declined, no reason specified Attempted to see pt again this afternoon, apparently he was awake and very willing to participate with OT earlier this afternoon.  However on my attempt he was trying to sleep and kept saying "You woke me from my nap" and despite multimodal cues and explanation about participating with PT he continued to adamantly refuse.  Will maintain on caseload and attempt tomorrow as appropriate.  Malachi Pro, DPT 10/21/2020, 3:59 PM

## 2020-10-21 NOTE — ED Notes (Signed)
Patient's brief and bed linen changed. Provided urinal at bedside as requested

## 2020-10-21 NOTE — ED Notes (Signed)
Pt brief changed. Repositioned via x2RNs

## 2020-10-21 NOTE — ED Notes (Signed)
Pt repositioned in bed x2 RNs. 

## 2020-10-21 NOTE — Progress Notes (Signed)
OT Cancellation Note  Patient Details Name: Scott Montgomery MRN: [id number] DOB: February 28, 1930   Cancelled Treatment:    Reason Eval/Treat Not Completed: Fatigue/lethargy limiting ability to participate OT order received, chart reviewed. Upon arrival to pt room, pt with physical therapy in room attempting evaluation. Pt noted to keep blanket pulled overhead, does not engage with physical therapist. Per nsg, has been sleeping a lot this am. Will hold at this time and re-attempt at a later date/time as available and pt able to participate in OT evaluation.   Rockney Ghee, M.S., OTR/L Ascom: [phone] 10/21/20, 10:02 AM

## 2020-10-21 NOTE — ED Notes (Signed)
OT evaluating patient at this time

## 2020-10-21 NOTE — ED Notes (Signed)
Patient able to tolerate PO medication. Eating food from tray and drinking appropriately at this time.

## 2020-10-21 NOTE — ED Provider Notes (Signed)
Emergency Medicine Observation Re-evaluation Note  Scott Montgomery is a 87 y.o. male, seen on rounds today.  Pt initially presented to the ED for complaints of Fall Currently, the patient is awaiting SW placement for higher level of care.   Physical Exam  BP (!) 165/73   Pulse 70   Temp 99.3 F (37.4 C) (Oral)   Resp 16   Ht 5\' 5"  (1.651 m)   Wt 50 kg   SpO2 95%   BMI 18.34 kg/m  Physical Exam General: appears chronically ill, resting comfortably, no distress  Cardiac: normal rate  Lungs: no increased work of breathing Psych: no behavioral issues   ED Course / MDM  EKG: n/a   I have reviewed the labs performed to date as well as medications administered while in observation.  Recent changes in the last 24 hours include readmission to ED for SW placement.  Plan  Current plan is for patient to be placed by SW in a facility that has a higher level of care.   Scott Montgomery is not under involuntary commitment.     Dahlia Client, MD 10/21/20 [phone]

## 2020-10-21 NOTE — Evaluation (Signed)
Occupational Therapy Evaluation Patient Details Name: Scott Montgomery MRN: [id number] DOB: March 29, 1929 Today's Date: 10/21/2020    History of Present Illness Per MD Notes: Scott Montgomery is a 87 y.o. male with dementia who comes in from ALF after a fall.  Patient was discharged from [facility] to [facility] on 10/20/20 after workup for UTI.  Upon arrival to [facility] within a 1 hour period patient had  unwitnessed fall. Staff brought him back to the ED for evaluation. Imaging negative for acute abnormality.   Clinical Impression   Scott Montgomery was seen for OT evaluation this date. Prior to hospital admission, pt was receiving care at an assisted living facility. Per chart, pt has had multiple unwitnessed falls at this facility in past month. Additional information regarding PLOF/home set-up limited as pt is unable to provide accurate history 2/2 baseline dementia. Currently pt demonstrates impairments as described below (See OT problem list) which functionally limit his ability to perform ADL/self-care tasks. Pt currently requires MOD A for LB ADL management, set-up/supervision for UB ADL management at bed level, unable to assess functional mobility.  Pt would benefit from skilled OT services to address noted impairments and functional limitations (see below for any additional details) in order to maximize safety and independence while minimizing falls risk and caregiver burden. Upon hospital discharge, recommend STR to maximize pt safety and return to PLOF.      Follow Up Recommendations  SNF;Supervision/Assistance - 24 hour (Will continue to monitor, pt rehab potential may be limited by cognition. May be more appropriate for LT care/memory care unit.)    Equipment Recommendations  Other (comment) (TBD)    Recommendations for Other Services       Precautions / Restrictions Precautions Precautions: Fall Restrictions Weight Bearing Restrictions: No      Mobility Bed  Mobility Overal bed mobility: Needs Assistance             General bed mobility comments: Deferred for pt safety. Pt with hx of agitation. Declines OOB/EOB this date despite encouragement. Will continue to assess at future sessions.    Transfers                      Balance Overall balance assessment: Needs assistance;Mild deficits observed, not formally tested                                         ADL either performed or assessed with clinical judgement   ADL Overall ADL's : Needs assistance/impaired                                       General ADL Comments: Pt is functionally limited by decreased safety awareness, impaired cognition, and generalized weakness. He declines functional activity this date. Anticipate MOD A for LB aDL managment from STS, Set-up/supervision for UB ADL management including grooming, feeding, and dressing.     Vision Baseline Vision/History: Wears glasses Patient Visual Report: No change from baseline Additional Comments: Pt appears to visually track appropriately. He is able to read this author's badge minimal difficulty. States his glasses were stollen.     Perception     Praxis      Pertinent Vitals/Pain Pain Assessment: No/denies pain     Hand Dominance Right   Extremity/Trunk Assessment Upper  Extremity Assessment Upper Extremity Assessment: Generalized weakness (BUE generally weak, grossly 3 to 3+/5 t/o No focal weakness appreciated. FMC decreased, but [facility].)   Lower Extremity Assessment Lower Extremity Assessment: Generalized weakness       Communication Communication Communication: No difficulties   Cognition Arousal/Alertness: Awake/alert Behavior During Therapy: WFL for tasks assessed/performed Overall Cognitive Status: History of cognitive impairments - at baseline                                 General Comments: Pt is oriented to self and limited place. He  remains pleasantly confused t/o session. He requires cueing/redirection to task consistently t/o session. Increased time/cueing to follow 1 step VCs. No caregiver present to determine baseline.   General Comments       Exercises Other Exercises Other Exercises: Pt education limited 2/2 cognition. OT facilitates education on falls prevention strategies, importance of functional activity during hospital admission, and sleepy hygine. Will continue to education at future sessions.   Shoulder Instructions      Home Living Family/patient expects to be discharged to:: Skilled nursing facility                                 Additional Comments: Per chart, pt living at ALF with assist for BADL management. Pt denies need for assist from staff however, he is unreliable historian 2/2 impaired cognition at baseline.      Prior Functioning/Environment Level of Independence: Needs assistance  Gait / Transfers Assistance Needed: No indication in chart if pt is using AE for functional mobility and no caregiver present. Pt with multiple falls in past 6 months. ADL's / Homemaking Assistance Needed: Pt reports he is able to dress and bathe independently. Unreliable historian, will continue to attempt to reach family/caregivers for PLOF.            OT Problem List: Decreased strength;Decreased coordination;Decreased range of motion;Decreased cognition;Decreased activity tolerance;Decreased safety awareness;Impaired balance (sitting and/or standing);Decreased knowledge of use of DME or AE      OT Treatment/Interventions: Self-care/ADL training;Therapeutic exercise;DME and/or AE instruction;Patient/family education;Energy conservation;Therapeutic activities    OT Goals(Current goals can be found in the care plan section) Acute Rehab OT Goals Patient Stated Goal: "to get back to my room" OT Goal Formulation: With patient Time For Goal Achievement: 11/04/20 Potential to Achieve Goals:  Fair ADL Goals Pt Will Perform Grooming: sitting;with set-up;with supervision Pt Will Perform Lower Body Dressing: with adaptive equipment;sit to/from stand;with min assist Pt Will Transfer to Toilet: bedside commode;with min assist;stand pivot transfer Pt Will Perform Toileting - Clothing Manipulation and hygiene: with adaptive equipment;sit to/from stand;with min assist  OT Frequency: Min 1X/week   Barriers to D/C: Decreased caregiver support          Co-evaluation              AM-PAC OT "6 Clicks" Daily Activity     Outcome Measure Help from another person eating meals?: A Little Help from another person taking care of personal grooming?: A Little Help from another person toileting, which includes using toliet, bedpan, or urinal?: A Lot Help from another person bathing (including washing, rinsing, drying)?: A Lot Help from another person to put on and taking off regular upper body clothing?: A Little Help from another person to put on and taking off regular lower body clothing?: A Lot 6 Click  Score: 15   End of Session Nurse Communication: Other (comment) (Pt would benefit from hospital bed.)  Activity Tolerance: Patient tolerated treatment well Patient left: in bed;with call bell/phone within reach;with bed alarm set  OT Visit Diagnosis: Other abnormalities of gait and mobility (R26.89);Muscle weakness (generalized) (M62.81);Repeated falls (R29.6)                Time: 1610-9604 OT Time Calculation (min): 19 min Charges:  OT General Charges $OT Visit: 1 Visit OT Evaluation $OT Eval Moderate Complexity: 1 Mod OT Treatments $Self Care/Home Management : 8-22 mins  Rockney Ghee, M.S., OTR/L Ascom: [phone] 10/21/20, 2:16 PM

## 2020-10-21 NOTE — ED Notes (Signed)
Pt asleep in bed.

## 2020-10-22 NOTE — Evaluation (Signed)
Physical Therapy Evaluation Patient Details Name: Scott Montgomery MRN: [id number] DOB: 08/12/1929 Today's Date: 10/22/2020   History of Present Illness  Scott Montgomery is a 87 y.o. male with dementia who comes in from ALF after a fall.  Patient was [facility] to [facility] on 10/20/20 after workup for UTI.  Upon arrival to [facility] within a 1 hour period patient had  unwitnessed fall. Staff brought him back to the ED for evaluation. Imaging negative for acute abnormality.  Clinical Impression  Pt initially did not wish to do much and couldn't believe that PT would actual try getting him up before lunch...  With plenty of cuing pt was ultimately in agreement to do some ambulation, he was impulsive and unsafe but overall was able to ambulate ~300 ft with walker and close supervision with multiple actual LOBs needing direct assist to maintain standing.  He was profoundly confused, showed no real safety awareness.      Follow Up Recommendations Supervision/Assistance - 24 hour;SNF    Equipment Recommendations  None recommended by PT    Recommendations for Other Services       Precautions / Restrictions Precautions Precautions: Fall Restrictions Weight Bearing Restrictions: No      Mobility  Bed Mobility Overal bed mobility: Needs Assistance Bed Mobility: Supine to Sit;Sit to Supine     Supine to sit: Min assist Sit to supine: Min assist;Min guard   General bed mobility comments: Pt needed some assist with getting up to sitting, did a better job getting LEs back up into bed    Transfers Overall transfer level: Needs assistance Equipment used: Rolling walker (2 wheeled) Transfers: Sit to/from Stand Sit to Stand: Min assist         General transfer comment: Pt initially leaning backward and struggled to shift weight forward on his own, however once assisted able to maintain standing  Ambulation/Gait Ambulation/Gait assistance: Mod assist;Min  assist Gait Distance (Feet): 300 Feet Assistive device: Rolling walker (2 wheeled)       General Gait Details: Pt able to do prolonged ambulation in the ED, but was impulsive and had multiple stagger steps and needs for direct assist to maintain standing. Pt with essentially no safety awareness and constantly telling PT I don't need to hold/help him, and inevitably needing assist to keep from falling, and then later being able to go ~50+ feet w/o needing any assist.  Good tolerance, but poor awareness.  Stairs            Wheelchair Mobility    Modified Rankin (Stroke Patients Only)       Balance Overall balance assessment: Needs assistance Sitting-balance support: Bilateral upper extremity supported Sitting balance-Leahy Scale: Good       Standing balance-Leahy Scale: Poor Standing balance comment: Pt with multiple falls, often getting tripped up with the walker, staggering to the R and generally speaking showing impulsive poor awareness with multiple LOBs during ambulation needing consistent assist (despite his protestations) to maintain safety.                             Pertinent Vitals/Pain Pain Assessment: No/denies pain    Home Living Family/patient expects to be discharged to:: Skilled nursing facility                 Additional Comments: Per chart, pt living at ALF with assist for BADL management. Pt denies need for assist from staff however,  he is unreliable historian 2/2 impaired cognition at baseline.    Prior Function Level of Independence: Needs assistance   Gait / Transfers Assistance Needed: Indicates he does not use a walker, unsure how true this is.  Pt with multiple falls in past 6 months.           Hand Dominance        Extremity/Trunk Assessment   Upper Extremity Assessment Upper Extremity Assessment: Generalized weakness;Overall [facility] for tasks assessed    Lower Extremity Assessment Lower Extremity Assessment:  Generalized weakness;Overall WFL for tasks assessed       Communication   Communication: No difficulties  Cognition Arousal/Alertness: Awake/alert Behavior During Therapy: Impulsive Overall Cognitive Status: History of cognitive impairments - at baseline                                        General Comments      Exercises     Assessment/Plan    PT Assessment Patient needs continued PT services  PT Problem List Decreased activity tolerance;Decreased balance;Decreased mobility;Decreased knowledge of use of DME;Decreased safety awareness;Cardiopulmonary status limiting activity       PT Treatment Interventions Gait training;DME instruction;Functional mobility training;Therapeutic activities;Therapeutic exercise;Balance training;Cognitive remediation;Patient/family education    PT Goals (Current goals can be found in the Care Plan section)  Acute Rehab PT Goals Patient Stated Goal: go back to sleep PT Goal Formulation: Patient unable to participate in goal setting Time For Goal Achievement: 11/05/20 Potential to Achieve Goals: Fair    Frequency Min 2X/week   Barriers to discharge        Co-evaluation               AM-PAC PT "6 Clicks" Mobility  Outcome Measure Help needed turning from your back to your side while in a flat bed without using bedrails?: A Little Help needed moving from lying on your back to sitting on the side of a flat bed without using bedrails?: A Little Help needed moving to and from a bed to a chair (including a wheelchair)?: A Little Help needed standing up from a chair using your arms (e.g., wheelchair or bedside chair)?: A Little Help needed to walk in hospital room?: A Little Help needed climbing 3-5 steps with a railing? : A Lot 6 Click Score: 17    End of Session Equipment Utilized During Treatment: Gait belt Activity Tolerance: Patient limited by fatigue Patient left: with call bell/phone within reach;with bed alarm  set Nurse Communication: Mobility status PT Visit Diagnosis: Muscle weakness (generalized) (M62.81);Difficulty in walking, not elsewhere classified (R26.2)    Time: 2426-8341 PT Time Calculation (min) (ACUTE ONLY): 20 min   Charges:   PT Evaluation $PT Eval Low Complexity: 1 Low PT Treatments $Gait Training: 8-22 mins        Malachi Pro, DPT 10/22/2020, 1:55 PM

## 2020-10-22 NOTE — ED Provider Notes (Signed)
Emergency Medicine Observation Re-evaluation Note  Scott Montgomery is a 86 y.o. male, seen on rounds today.    Physical Exam  BP (!) 110/94 (BP Location: Right Arm)   Pulse 69   Temp 97.6 F (36.4 C) (Oral)   Resp 17   Ht 5\' 5"  (1.651 m)   Wt 50 kg   SpO2 96%   BMI 18.34 kg/m  Physical Exam General: Patient resting in bed Lungs: Patient in no respiratory distress Psych: Patient is not combative  ED Course / MDM  EKG:   I have reviewed the labs performed to date as well as medications administered while in observation.  No changes in the last 24 hours.  LFTs are slightly higher now than last month.  May be useful to repeat them in a few days just to check on the trend.  Plan  Current plan is for social work placement  is not under involuntary commitment.     [employer], MD 10/22/20 [phone]

## 2020-10-22 NOTE — ED Notes (Signed)
Report received from Kelly, RN

## 2020-10-22 NOTE — ED Notes (Signed)
Pt asleep in bed.

## 2020-10-22 NOTE — ED Notes (Signed)
Johnny Bridge contacted at [phone] to update her on pt current status at this time.

## 2020-10-22 NOTE — ED Notes (Signed)
Pt eating lunch tray  

## 2020-10-22 NOTE — ED Notes (Signed)
Bed alarm heard. Pt repositioned x2 staff members at this time. Pt Alert and pleasant at this time.

## 2020-10-22 NOTE — ED Notes (Signed)
Thi RN assisted pt to toilet and linen change..  Pt had BM and urinated. Pt refused assistance with peri care and clothing change.

## 2020-10-22 NOTE — ED Notes (Signed)
Pt given meal tray. Pt readjusted in bed. Lights dimmed to promote comfort per pt request

## 2020-10-22 NOTE — ED Notes (Signed)
Pt ambulatory to toilet in room with assistance 

## 2020-10-22 NOTE — TOC Progression Note (Addendum)
Transition of Care [facility]) - Progression Note    Patient Details  Name: Kelton Bultman MRN: [id number] Date of Birth: 02/28/1930  [facility] Care [facility]) CM/SW Contact   Cellar, RN Phone Number: 10/22/2020, 1:59 PM  Clinical Narrative:    Received call from [facility] requesting Fl2 be sent via email to [email or web address] Confirmed patient is 100% connected to [place] and eligible for placement. [facility] will [facility] with [facility] to [facility] unit.   Secure email sent to [facility] for assistance with placement into memory care.Received reply requesting [facility] form completed and additional information faxed to [place].    Expected Discharge Plan: Memory Care Barriers to Discharge: ED Patient Insisting on an Alternate Living Situation/Facility  Expected Discharge Plan and Services Expected Discharge Plan: Memory Care       Living arrangements for the past 2 months: Assisted Living Facility                                       Social Determinants of Health (SDOH) Interventions    Readmission Risk Interventions No flowsheet data found.

## 2020-10-22 NOTE — ED Notes (Signed)
Breakfast tray given. °

## 2020-10-23 MED ORDER — LORAZEPAM 2 MG PO TABS
2.0000 mg | ORAL_TABLET | Freq: Once | ORAL | Status: AC
  Administered 2020-10-23: 2 mg via ORAL
  Filled 2020-10-23: qty 1

## 2020-10-23 NOTE — ED Notes (Signed)
This RN heard bed alarm sounding. Pt reports he needs to use the RR. This RN and tech assisted pt to the RR. Pt back in bed at this time.

## 2020-10-23 NOTE — ED Notes (Signed)
Report to Crystal, RN.

## 2020-10-23 NOTE — NC FL2 (Addendum)
[place] MEDICAID FL2 LEVEL OF CARE SCREENING TOOL     IDENTIFICATION  Patient Name: Scott Montgomery Birthdate: [birth year] Sex: male Admission Date (Current Location): 10/20/2020  [facility] and [place] Number:  Chiropodist and Address:  [facility], [address] [place], [place] [address]      Provider Number: [id number]  Attending Physician Name and Address:  No att. providers found  Relative Name and Phone Number:  Johnny Bridge [phone]    Current Level of Care: Hospital Recommended Level of Care: Memory Care Prior Approval Number:    Date Approved/Denied:   PASRR Number:    Discharge Plan: Other (Comment) (Memory Care)    Current Diagnoses: Patient Active Problem List   Diagnosis Date Noted   Protein-calorie malnutrition, severe 10/19/2020   Acute metabolic encephalopathy 10/18/2020   Fall 10/17/2020   Dementia (HCC) 10/17/2020   Essential hypertension 10/17/2020   Elevated troponin 10/17/2020   Acute lower UTI 10/17/2020   History of seizure 10/17/2020   BPH (benign prostatic hyperplasia) 10/17/2020   Confusion 10/17/2020   Dementia with behavioral disturbance (HCC) 10/17/2020    Orientation RESPIRATION BLADDER Height & Weight     Self  Normal Continent Weight: 50 kg Height:  5\' 5"  (165.1 cm)  BEHAVIORAL SYMPTOMS/MOOD NEUROLOGICAL BOWEL NUTRITION STATUS      Continent Diet (Regular)  AMBULATORY STATUS COMMUNICATION OF NEEDS Skin   Supervision Verbally Normal                       Personal Care Assistance Level of Assistance  Bathing, Feeding, Dressing Bathing Assistance: Limited assistance Feeding assistance: Independent Dressing Assistance: Independent     Functional Limitations Info  Hearing   Hearing Info: Impaired      SPECIAL CARE FACTORS FREQUENCY                       Contractures Contractures Info: Not present    Additional Factors Info  Code Status (DNR) Code Status Info:  DNR Allergies Info: NKDA           Current Medications (10/23/2020):  This is the current hospital active medication list Current Facility-Administered Medications  Medication Dose Route Frequency Provider Last Rate Last Admin   acetaminophen (TYLENOL) tablet 650 mg  650 mg Oral TID 12/23/2020, MD   650 mg at 10/23/20 1010   cholecalciferol (VITAMIN D3) tablet 2,000 Units  2,000 Units Oral Daily 12/23/20, MD   2,000 Units at 10/23/20 1011   divalproex (DEPAKOTE SPRINKLE) capsule 125 mg  125 mg Oral BID 12/23/20, MD   125 mg at 10/23/20 1012   FLUoxetine (PROZAC) capsule 20 mg  20 mg Oral Daily 12/23/20, MD   20 mg at 10/23/20 1011   methylphenidate (RITALIN) tablet 5 mg  5 mg Oral BID 12/23/20, MD   5 mg at 10/23/20 1011   risperiDONE (RISPERDAL) tablet 1 mg  1 mg Oral QHS 12/23/20, MD   1 mg at 10/22/20 2222   senna-docusate (Senokot-S) tablet 2 tablet  2 tablet Oral BID 2223, MD   2 tablet at 10/23/20 1010   tamsulosin (FLOMAX) capsule 0.4 mg  0.4 mg Oral Daily 12/23/20, MD   0.4 mg at 10/23/20 1010   traZODone (DESYREL) tablet 100 mg  100 mg Oral QHS 12/23/20, MD   100 mg at 10/22/20 2222  vitamin B-12 (CYANOCOBALAMIN) tablet 1,000 mcg  1,000 mcg Oral Daily Concha Se, MD   1,000 mcg at 10/23/20 1011   Current Outpatient Medications  Medication Sig Dispense Refill   acetaminophen (TYLENOL) 325 MG tablet Take 650 mg by mouth 3 (three) times daily.     cholecalciferol (VITAMIN D) 25 MCG (1000 UNIT) tablet Take 2,000 Units by mouth daily.     divalproex (DEPAKOTE SPRINKLE) 125 MG capsule Take 125 mg by mouth 2 (two) times daily.     FLUoxetine (PROZAC) 20 MG capsule Take 20 mg by mouth daily.     methylphenidate (RITALIN) 5 MG tablet Take 5 mg by mouth 2 (two) times daily.     risperiDONE (RISPERDAL) 1 MG tablet Take 1 mg by mouth at bedtime.     senna-docusate (SENOKOT-S) 8.6-50 MG tablet Take 2 tablets by mouth 2 (two) times daily.      tamsulosin (FLOMAX) 0.4 MG CAPS capsule Take 0.4 mg by mouth.     traZODone (DESYREL) 100 MG tablet Take 100 mg by mouth at bedtime.     vitamin B-12 (CYANOCOBALAMIN) 1000 MCG tablet Take 1,000 mcg by mouth daily.       Discharge Medications: Please see discharge summary for a list of discharge medications.  Relevant Imaging Results:  Relevant Lab Results:   Additional Information SS# [id number]  [place] Cellar, RN

## 2020-10-23 NOTE — ED Provider Notes (Signed)
-----------------------------------------   5:12 AM on 10/23/2020 -----------------------------------------   Blood pressure (!) 110/94, pulse 69, temperature 97.6 F (36.4 C), temperature source Oral, resp. rate 17, height 5\' 5"  (1.651 m), weight 50 kg, SpO2 96 %.  The patient is calm and cooperative at this time.  There have been no acute events since the last update.  Awaiting disposition plan from Social Work team.   , MD 10/23/20 [phone]

## 2020-10-23 NOTE — TOC Progression Note (Signed)
Transition of Care [facility]) - Progression Note    Patient Details  Name: Scott Montgomery MRN: [id number] Date of Birth: May 13, 1929  [facility] CM/SW Contact  [place] Cellar, RN Phone Number: 10/23/2020, 12:32 PM  Clinical Narrative:    Requested paperwork faxed to [facility] [phone].   Spoke to [place], Museum/gallery conservator with update on bed search. Reviewed participating facilities with Johnny Bridge who states she will update patients daughters. Understands no local participating providers offered through [place].    Expected Discharge Plan: Memory Care Barriers to Discharge: ED Patient Insisting on an Alternate Living Situation/Facility  Expected Discharge Plan and Services Expected Discharge Plan: Memory Care       Living arrangements for the past 2 months: Assisted Living Facility                                       Social Determinants of Health (SDOH) Interventions    Readmission Risk Interventions No flowsheet data found.

## 2020-10-23 NOTE — ED Notes (Addendum)
Wife came to visit. Asked about pt wedding band and was he wearing it when he came in. I advised I was unable to know that as he has been here so long and I didn't triage him initially

## 2020-10-23 NOTE — ED Notes (Addendum)
Urine incontinence, brief changed, pt repositioned in bed. Pills fed with applesauce and ice cream with multiple reminders to open mouth. Pt provided with juice with spoon and continuously swallows and coughs, will speak with provider for swallow consult.

## 2020-10-23 NOTE — ED Notes (Signed)
Changed pt brief. Pt had a BM.

## 2020-10-23 NOTE — ED Notes (Signed)
Pt very agitated and restless and trying to climb out of bed. MSG MD for some medications to admin to help keep pt calm and in bed.

## 2020-10-24 ENCOUNTER — Encounter: Payer: Self-pay | Admitting: Internal Medicine

## 2020-10-24 ENCOUNTER — Emergency Department: Payer: No Typology Code available for payment source

## 2020-10-24 DIAGNOSIS — R296 Repeated falls: Secondary | ICD-10-CM | POA: Diagnosis present

## 2020-10-24 DIAGNOSIS — E43 Unspecified severe protein-calorie malnutrition: Secondary | ICD-10-CM | POA: Diagnosis present

## 2020-10-24 DIAGNOSIS — Z681 Body mass index (BMI) 19 or less, adult: Secondary | ICD-10-CM | POA: Diagnosis not present

## 2020-10-24 DIAGNOSIS — N400 Enlarged prostate without lower urinary tract symptoms: Secondary | ICD-10-CM | POA: Diagnosis present

## 2020-10-24 DIAGNOSIS — G9341 Metabolic encephalopathy: Secondary | ICD-10-CM | POA: Diagnosis present

## 2020-10-24 DIAGNOSIS — F0391 Unspecified dementia with behavioral disturbance: Secondary | ICD-10-CM | POA: Diagnosis present

## 2020-10-24 DIAGNOSIS — I10 Essential (primary) hypertension: Secondary | ICD-10-CM

## 2020-10-24 DIAGNOSIS — W19XXXA Unspecified fall, initial encounter: Secondary | ICD-10-CM | POA: Diagnosis present

## 2020-10-24 DIAGNOSIS — J690 Pneumonitis due to inhalation of food and vomit: Secondary | ICD-10-CM | POA: Diagnosis present

## 2020-10-24 DIAGNOSIS — Z23 Encounter for immunization: Secondary | ICD-10-CM | POA: Diagnosis not present

## 2020-10-24 DIAGNOSIS — Z515 Encounter for palliative care: Secondary | ICD-10-CM | POA: Diagnosis not present

## 2020-10-24 DIAGNOSIS — R1319 Other dysphagia: Secondary | ICD-10-CM | POA: Diagnosis not present

## 2020-10-24 DIAGNOSIS — R627 Adult failure to thrive: Secondary | ICD-10-CM | POA: Diagnosis present

## 2020-10-24 DIAGNOSIS — Z5320 Procedure and treatment not carried out because of patient's decision for unspecified reasons: Secondary | ICD-10-CM | POA: Diagnosis present

## 2020-10-24 DIAGNOSIS — F0390 Unspecified dementia without behavioral disturbance: Secondary | ICD-10-CM | POA: Diagnosis not present

## 2020-10-24 DIAGNOSIS — Z66 Do not resuscitate: Secondary | ICD-10-CM | POA: Diagnosis present

## 2020-10-24 DIAGNOSIS — Z7189 Other specified counseling: Secondary | ICD-10-CM | POA: Diagnosis not present

## 2020-10-24 DIAGNOSIS — E785 Hyperlipidemia, unspecified: Secondary | ICD-10-CM | POA: Diagnosis present

## 2020-10-24 DIAGNOSIS — Z20822 Contact with and (suspected) exposure to covid-19: Secondary | ICD-10-CM | POA: Diagnosis present

## 2020-10-24 DIAGNOSIS — J9601 Acute respiratory failure with hypoxia: Secondary | ICD-10-CM | POA: Diagnosis present

## 2020-10-24 DIAGNOSIS — S0081XA Abrasion of other part of head, initial encounter: Secondary | ICD-10-CM | POA: Diagnosis present

## 2020-10-24 DIAGNOSIS — Z9119 Patient's noncompliance with other medical treatment and regimen: Secondary | ICD-10-CM | POA: Diagnosis not present

## 2020-10-24 DIAGNOSIS — L89896 Pressure-induced deep tissue damage of other site: Secondary | ICD-10-CM | POA: Diagnosis present

## 2020-10-24 DIAGNOSIS — R1310 Dysphagia, unspecified: Secondary | ICD-10-CM | POA: Diagnosis present

## 2020-10-24 DIAGNOSIS — Z79899 Other long term (current) drug therapy: Secondary | ICD-10-CM | POA: Diagnosis not present

## 2020-10-24 LAB — COMPREHENSIVE METABOLIC PANEL
ALT: 49 U/L — ABNORMAL HIGH (ref 0–44)
AST: 45 U/L — ABNORMAL HIGH (ref 15–41)
Albumin: 2.8 g/dL — ABNORMAL LOW (ref 3.5–5.0)
Alkaline Phosphatase: 43 U/L (ref 38–126)
Anion gap: 8 (ref 5–15)
BUN: 19 mg/dL (ref 8–23)
CO2: 29 mmol/L (ref 22–32)
Calcium: 9.1 mg/dL (ref 8.9–10.3)
Chloride: 102 mmol/L (ref 98–111)
Creatinine, Ser: 0.84 mg/dL (ref 0.61–1.24)
GFR, Estimated: >60 mL/min (ref >60)
Glucose, Bld: 135 mg/dL — ABNORMAL HIGH (ref 70–99)
Potassium: 4.2 mmol/L (ref 3.5–5.1)
Sodium: 139 mmol/L (ref 135–145)
Total Bilirubin: 1.2 mg/dL (ref 0.3–1.2)
Total Protein: 6.1 g/dL — ABNORMAL LOW (ref 6.5–8.1)

## 2020-10-24 LAB — BLOOD GAS, ARTERIAL
Acid-Base Excess: 5.1 mmol/L — ABNORMAL HIGH (ref 0.0–2.0)
Bicarbonate: 29.9 mmol/L — ABNORMAL HIGH (ref 20.0–28.0)
FIO2: 0.36
O2 Saturation: 95 %
Patient temperature: 37
pCO2 arterial: 44 mmHg (ref 32.0–48.0)
pH, Arterial: 7.44 (ref 7.350–7.450)
pO2, Arterial: 73 mmHg — ABNORMAL LOW (ref 83.0–108.0)

## 2020-10-24 LAB — CBC WITH DIFFERENTIAL/PLATELET
Abs Immature Granulocytes: 0.09 10*3/uL — ABNORMAL HIGH (ref 0.00–0.07)
Basophils Absolute: 0 10*3/uL (ref 0.0–0.1)
Basophils Relative: 0 %
Eosinophils Absolute: 0 10*3/uL (ref 0.0–0.5)
Eosinophils Relative: 0 %
HCT: 39.7 % (ref 39.0–52.0)
Hemoglobin: 13.3 g/dL (ref 13.0–17.0)
Immature Granulocytes: 1 %
Lymphocytes Relative: 6 %
Lymphs Abs: 0.9 10*3/uL (ref 0.7–4.0)
MCH: 32.9 pg (ref 26.0–34.0)
MCHC: 33.5 g/dL (ref 30.0–36.0)
MCV: 98.3 fL (ref 80.0–100.0)
Monocytes Absolute: 1.2 10*3/uL — ABNORMAL HIGH (ref 0.1–1.0)
Monocytes Relative: 8 %
Neutro Abs: 13.4 10*3/uL — ABNORMAL HIGH (ref 1.7–7.7)
Neutrophils Relative %: 85 %
Platelets: 175 10*3/uL (ref 150–400)
RBC: 4.04 MIL/uL — ABNORMAL LOW (ref 4.22–5.81)
RDW: 12.4 % (ref 11.5–15.5)
WBC: 15.6 10*3/uL — ABNORMAL HIGH (ref 4.0–10.5)
nRBC: 0 % (ref 0.0–0.2)

## 2020-10-24 LAB — LACTIC ACID, PLASMA
Lactic Acid, Venous: 2.1 mmol/L (ref 0.5–1.9)
Lactic Acid, Venous: 1.5 mmol/L (ref 0.5–1.9)

## 2020-10-24 LAB — TROPONIN I (HIGH SENSITIVITY): Troponin I (High Sensitivity): 37 ng/L — ABNORMAL HIGH (ref <18)

## 2020-10-24 MED ORDER — SODIUM CHLORIDE 0.9% FLUSH
3.0000 mL | Freq: Two times a day (BID) | INTRAVENOUS | Status: DC
  Administered 2020-10-25 – 2020-11-04 (×17): 3 mL via INTRAVENOUS

## 2020-10-24 MED ORDER — SODIUM CHLORIDE 0.9 % IV SOLN
2.0000 g | Freq: Once | INTRAVENOUS | Status: AC
  Administered 2020-10-24: 2 g via INTRAVENOUS
  Filled 2020-10-24: qty 2

## 2020-10-24 MED ORDER — VANCOMYCIN HCL 1250 MG/250ML IV SOLN
1250.0000 mg | Freq: Once | INTRAVENOUS | Status: AC
  Administered 2020-10-24: 1250 mg via INTRAVENOUS
  Filled 2020-10-24: qty 250

## 2020-10-24 MED ORDER — SODIUM CHLORIDE 0.9% FLUSH: 3.0000 mL | INTRAVENOUS | Status: DC | PRN

## 2020-10-24 MED ORDER — SODIUM CHLORIDE 0.9 % IV SOLN: 250.0000 mL | INTRAVENOUS | Status: DC | PRN

## 2020-10-24 MED ORDER — ENOXAPARIN SODIUM 40 MG/0.4ML IJ SOSY
40.0000 mg | PREFILLED_SYRINGE | INTRAMUSCULAR | Status: DC
  Administered 2020-10-24 – 2020-11-03 (×10): 40 mg via SUBCUTANEOUS
  Filled 2020-10-24 (×11): qty 0.4

## 2020-10-24 MED ORDER — SODIUM CHLORIDE 0.9 % IV SOLN
3.0000 g | Freq: Four times a day (QID) | INTRAVENOUS | Status: DC
  Administered 2020-10-24 – 2020-10-26 (×8): 3 g via INTRAVENOUS
  Filled 2020-10-24: qty 3
  Filled 2020-10-24 (×3): qty 8
  Filled 2020-10-24 (×2): qty 3
  Filled 2020-10-24: qty 8
  Filled 2020-10-24: qty 3
  Filled 2020-10-24 (×2): qty 8
  Filled 2020-10-24: qty 3

## 2020-10-24 MED ORDER — LACTATED RINGERS IV SOLN: INTRAVENOUS | Status: DC

## 2020-10-24 MED ORDER — LACTATED RINGERS IV BOLUS
500.0000 mL | Freq: Once | INTRAVENOUS | Status: AC
  Administered 2020-10-24: 500 mL via INTRAVENOUS

## 2020-10-24 MED ORDER — LACTATED RINGERS IV BOLUS
1000.0000 mL | Freq: Once | INTRAVENOUS | Status: AC
  Administered 2020-10-24: 1000 mL via INTRAVENOUS

## 2020-10-24 NOTE — ED Notes (Signed)
RT at bedside.

## 2020-10-24 NOTE — ED Notes (Signed)
Dressings present on assessment to right foot, left shoulder, bilateral knees, and forehead removed. Wounds cleaned with wipes, and bandaid reapplied to right foot and left shoulder. Both knees left open to air.

## 2020-10-24 NOTE — ED Notes (Signed)
Pt brief and chuck saturated with urine. Pt cleaned and new brief and chuck placed on pt. Pt moved up in bed and sat up to take medicine.

## 2020-10-24 NOTE — Consult Note (Signed)
Pharmacy Antibiotic Note  Scott Montgomery is a 87 y.o. male admitted on 10/20/2020 with aspiration pneumonia.  Pharmacy has been consulted for ampicillin-sulbactam dosing.  Plan:  Has received vancomycin 1250mg  IV load once  Will start ampicillin-sulbactam 3g IV every 6 hours.    Height: 5\' 5"  (165.1 cm) Weight: 50 kg (110 lb 3.7 oz) IBW/kg (Calculated) : 61.5  Temp (24hrs), Avg:98.5 F (36.9 C), Min:98.5 F (36.9 C), Max:98.5 F (36.9 C)  Recent Labs  Lab 10/17/20 2226 10/18/20 0555 10/20/20 2238 10/24/20 1344  WBC 10.8* 9.0 10.0 15.6*  CREATININE  --  0.90 0.94 0.84    Estimated Creatinine Clearance: 42.2 mL/min (by C-G formula based on SCr of 0.84 mg/dL).    No Known Allergies  Antimicrobials this admission: Vancomycin 0806 >> 0806 Ampicillin-sulbactam 0806 >>   Dose adjustments this admission: No dose adjustments have been made.   Microbiology results: 0806 BCx: pending UCx: not collected  Sputum: not collected  MRSA PCR: not collected   Thank you for allowing pharmacy to be a part of this patient's care.  12/24/20, PharmD Pharmacy Resident  10/24/2020 3:58 PM

## 2020-10-24 NOTE — ED Provider Notes (Addendum)
During my shift the patient was noted to desaturate into the low 80s and is now on 4 L nasal cannula.  In brief this gentleman was waiting for social work placement for higher level of care after he recently bounced back to the ED after a fall at his assisted living after just being admitted for short period of time for fall.  Chest x-ray was ordered which shows some hazy opacities bilaterally which may be due to aspiration pneumonia.  His labs are notable for leukocytosis to 15 which is new.  CMP and high-sensitivity troponin pending.  Given his new hypoxia and likely aspiration related pneumonia will require admission to the hospital.  When he has been residing in the emergency department and recently hospitalized will treat for hospital-acquired pneumonia with cefepime and vancomycin.  Georga Hacking, MD 10/24/20 1343    Georga Hacking, MD 10/24/20 Izell Haakon

## 2020-10-24 NOTE — ED Notes (Addendum)
Pt awake, provided pt with thickened cranberry juice, no cough or clearing throat noted. Additional warm blanket provided

## 2020-10-24 NOTE — ED Notes (Signed)
Dr Agbata at bedside. 

## 2020-10-24 NOTE — ED Notes (Signed)
Meplex to bilateral heels and sacrum intact, and clean. Dressings lefts in place.

## 2020-10-24 NOTE — ED Notes (Signed)
Inpatient unit contacted for RN assignment. Patient resting comfortably.

## 2020-10-24 NOTE — ED Notes (Signed)
Called RT to draw ABG  

## 2020-10-24 NOTE — ED Notes (Signed)
Patient resting comfortably. HOB elevated to >45 degrees. Patient remains on cardiac monitor. NADN.

## 2020-10-24 NOTE — ED Notes (Signed)
Patient socks replaced. Patient bed bath performed. Patient brief changed, and external catheter placed. Patient repositioned. Patient offered some pureed meal, but did not eat much at this time.

## 2020-10-24 NOTE — ED Notes (Signed)
Speech therapy at bedside.

## 2020-10-24 NOTE — H&P (Signed)
History and Physical    Scott Montgomery EQA:[id number] DOB: [birth year] DOA: 10/20/2020  PCP: [facility]   Patient coming from: Assisted living facility  I have personally briefly reviewed patient's old medical records in [facility]  Chief Complaint: Change in mental status Patient is unable to provide any history at this time and most of the history is obtained from the EMR HPI: Scott Montgomery is a 87 y.o. male with medical history significant for dyslipidemia, BPH, hypertension, dementia, generalized anxiety disorder, pseudophakia who was recently discharged from the hospital to an assisted living facility after an admission for a fall with rhabdomyolysis and dementia with behavioral disturbances. Patient was sent back to the emergency room after he sustained an unwitnessed fall at the skilled nursing facility.  Upon arrival to the ER he was noted to be alert and oriented x1. Patient has been holding in the ER awaiting placement in a skilled nursing facility. Admission was requested to medicine service because during rounds on the day of admission patient was noted to be hypoxic with room air pulse oximetry of 81% and was placed on 4 L of oxygen with improvement in his pulse oximetry to the high 90s. Patient is lethargic and unable to provide any history at this time. He has a wet sounding cough but is unable to cough up any phlegm. He was assessed by speech therapy and found to have level 1 dysphagia.  Recommendations were made as to help patient should be fed.  He was also noted to have a moderate aspiration risk as well as risk for inadequate nutrition/hydration related to his underlying dementia. I am unable to do a review of systems on this patient due to his mental status Arterial blood gas 7.4/44/73/29.9/95 Labs show sodium 139, potassium 4.2, chloride 102, bicarb 29, glucose 135, BUN 19, creatinine 0.84, calcium 9.1, alkaline phosphatase 43,  albumin 2.8, AST 45, ALT 49, total protein 6.1, troponin 37, white count 15.6, hemoglobin 13.3, hematocrit 39.7, MCV 98.3, RDW 12.4, platelet count 175 Chest x-ray reviewed by me shows hazy densities at both lung bases concerning for early pneumonia given patient's history of aspiration  ED Course: Patient is an 87 year old Caucasian male who has been holding in the emergency room while awaiting discharge to a skilled nursing facility.  Medical admission has been requested because of acute respiratory failure.  Patient was noted to be hypoxic with room air pulse oximetry of 81% and is currently on 4 L of oxygen with improvement in his pulse oximetry to 93%. He has a wet sounding cough and chest x-ray is suggestive of possible aspiration.  He also has leukocytosis. He received a dose of IV cefepime and vancomycin in the ER He will be admitted to the hospital for further evaluation and treatment.     Review of Systems: As per HPI otherwise all other systems reviewed and negative.    Past Medical History:  Diagnosis Date   Dementia (HCC)    Hypertension     History reviewed. No pertinent surgical history.   reports previous alcohol use. He reports that he does not use drugs. No history on file for tobacco use.  No Known Allergies  History reviewed. No pertinent family history.  Unable to get history from patient  Prior to Admission medications   Medication Sig Start Date End Date Taking? Authorizing Provider  acetaminophen (TYLENOL) 325 MG tablet Take 650 mg by mouth 3 (three) times daily.   Yes [provider]  cholecalciferol (VITAMIN D) 25 MCG (1000 UNIT) tablet Take 2,000 Units by mouth daily.   Yes [provider]  divalproex (DEPAKOTE SPRINKLE) 125 MG capsule Take 125 mg by mouth 2 (two) times daily.   Yes [provider]  FLUoxetine (PROZAC) 20 MG capsule Take 20 mg by mouth daily.   Yes [provider]  methylphenidate (RITALIN) 5 MG tablet  Take 5 mg by mouth 2 (two) times daily.   Yes [provider]  risperiDONE (RISPERDAL) 1 MG tablet Take 1 mg by mouth at bedtime.   Yes [provider]  senna-docusate (SENOKOT-S) 8.6-50 MG tablet Take 2 tablets by mouth 2 (two) times daily.   Yes [provider]  tamsulosin (FLOMAX) 0.4 MG CAPS capsule Take 0.4 mg by mouth.   Yes [provider]  traZODone (DESYREL) 100 MG tablet Take 100 mg by mouth at bedtime.   Yes [provider]  vitamin B-12 (CYANOCOBALAMIN) 1000 MCG tablet Take 1,000 mcg by mouth daily.   Yes [provider]    Physical Exam: Vitals:   10/24/20 1331 10/24/20 1351 10/24/20 1400 10/24/20 1430  BP:   138/67 (!) 108/50  Pulse:  67 63 66  Resp:      Temp:      TempSrc:      SpO2: 93% 95% 98% 92%  Weight:      Height:         Vitals:   10/24/20 1331 10/24/20 1351 10/24/20 1400 10/24/20 1430  BP:   138/67 (!) 108/50  Pulse:  67 63 66  Resp:      Temp:      TempSrc:      SpO2: 93% 95% 98% 92%  Weight:      Height:          Constitutional: Lethargic with minimal response to painful stimuli. Not in any apparent distress.  Thin and frail HEENT:      Head: Normocephalic and atraumatic.         Eyes: PERLA, EOMI, Conjunctivae are normal. Sclera is non-icteric.       Mouth/Throat: Mucous membranes are moist.       Neck: Supple with no signs of meningismus. Cardiovascular: Regular rate and rhythm. No murmurs, gallops, or rubs. 2+ symmetrical distal pulses are present . No JVD. No LE edema Respiratory: Diffuse rhonchi in all lung fields.. No wheezes Gastrointestinal: Soft, non tender, and non distended with positive bowel sounds.  Genitourinary: No CVA tenderness. Musculoskeletal: Nontender with normal range of motion in all extremities. No cyanosis, or erythema of extremities. Neurologic: Unable to assess. Skin: Ecchymosis on forearms Psychiatric: Unable to assess   Labs on Admission: I have personally  reviewed following labs and imaging studies  CBC: Recent Labs  Lab 10/17/20 2226 10/18/20 0555 10/20/20 2238 10/24/20 1344  WBC 10.8* 9.0 10.0 15.6*  NEUTROABS  --   --  7.0 13.4*  HGB 12.7* 12.6* 13.4 13.3  HCT 38.2* 37.5* 39.8 39.7  MCV 100.3* 100.5* 98.3 98.3  PLT 131* 127* 143* 175   Basic Metabolic Panel: Recent Labs  Lab 10/18/20 0555 10/20/20 2238 10/24/20 1344  NA 142 140 139  K 3.9 4.0 4.2  CL 106 103 102  CO2 30 27 29   GLUCOSE 76 123* 135*  BUN 37* 27* 19  CREATININE 0.90 0.94 0.84  CALCIUM 8.9 9.1 9.1   GFR: Estimated Creatinine Clearance: 42.2 mL/min (by C-G formula based on SCr of 0.84 mg/dL). Liver Function Tests: Recent  Labs  Lab 10/20/20 2238 10/24/20 1344  AST 131* 45*  ALT 67* 49*  ALKPHOS 41 43  BILITOT 1.0 1.2  PROT 6.5 6.1*  ALBUMIN 3.3* 2.8*   No results for input(s): LIPASE, AMYLASE in the last 168 hours. No results for input(s): AMMONIA in the last 168 hours. Coagulation Profile: Recent Labs  Lab 10/17/20 1810  INR 1.0   Cardiac Enzymes: No results for input(s): CKTOTAL, CKMB, CKMBINDEX, TROPONINI in the last 168 hours. BNP (last 3 results) No results for input(s): PROBNP in the last 8760 hours. HbA1C: No results for input(s): HGBA1C in the last 72 hours. CBG: No results for input(s): GLUCAP in the last 168 hours. Lipid Profile: No results for input(s): CHOL, HDL, LDLCALC, TRIG, CHOLHDL, LDLDIRECT in the last 72 hours. Thyroid Function Tests: No results for input(s): TSH, T4TOTAL, FREET4, T3FREE, THYROIDAB in the last 72 hours. Anemia Panel: No results for input(s): VITAMINB12, FOLATE, FERRITIN, TIBC, IRON, RETICCTPCT in the last 72 hours. Urine analysis:    Component Value Date/Time   COLORURINE YELLOW (A) 10/17/2020 0821   APPEARANCEUR HAZY (A) 10/17/2020 0821   LABSPEC 1.024 10/17/2020 0821   PHURINE 6.0 10/17/2020 0821   GLUCOSEU NEGATIVE 10/17/2020 0821   HGBUR LARGE (A) 10/17/2020 0821   BILIRUBINUR NEGATIVE  10/17/2020 0821   KETONESUR 20 (A) 10/17/2020 0821   PROTEINUR 30 (A) 10/17/2020 0821   NITRITE POSITIVE (A) 10/17/2020 0821   LEUKOCYTESUR MODERATE (A) 10/17/2020 0821    Radiological Exams on Admission: DG Chest Portable 1 View  Result Date: 10/24/2020 CLINICAL DATA:  Cough, aspiration, hypoxia. EXAM: PORTABLE CHEST 1 VIEW COMPARISON:  10/20/2020 FINDINGS: Single view of the chest was obtained. Again noted is slight elevation of left hemidiaphragm. Hazy densities in lower lungs. Heart size is within normal limits and stable. Again noted is a displaced fracture involving the left sixth rib. Negative for a pneumothorax. IMPRESSION: Hazy densities at both lung bases. Findings could represent atelectasis but based on history of aspiration, findings also could represent early pneumonia. Consider follow-up imaging. Electronically Signed   By: Richarda OverlieAdam  Henn M.D.   On: 10/24/2020 13:59     Assessment/Plan Principal Problem:   Aspiration pneumonia (HCC) Active Problems:   Acute metabolic encephalopathy   Fall   Essential hypertension   Dementia with behavioral disturbance (HCC)   Protein-calorie malnutrition, severe   Dysphagia causing pulmonary aspiration with swallowing    Aspiration pneumonia with acute respiratory failure  Patient with a history of dementia and dysphagia noted to be hypoxic with room air pulse oximetry of 81% and is currently on 4 L of oxygen to maintain pulse oximetry greater than 92%. Chest x-ray shows findings suggestive of possible aspiration and patient has leukocytosis with a white count of 15,000 He is afebrile Will place patient on Unasyn Strict aspiration precautions Keep head of bed elevated     Acute metabolic encephalopathy Unclear etiology may be secondary to underlying pneumonia At baseline patient is usually awake, alert and oriented to person but at this time is very lethargic and only responds to pain Will monitor mental status closely Hold all oral  medications Keep patient n.p.o. for now     Protein calorie malnutrition Most likely secondary to underlying dementia     Dementia with behavioral disturbances Hold all antipsychotics for now due to altered mental status    DVT prophylaxis: Lovenox  Code Status: DO NOT RESUSCITATE Family Communication: Greater than 50% of time was spent discussing patient's condition and plan of  care with his wife, Ms Salem Lembke over the phone.  All questions and concerns have been addressed.  She verbalizes understanding and agrees with the treatment plan.  CODE STATUS was discussed and he is a DNR Disposition Plan: Back to previous home environment Consults called: Palliative care Status: At the time of admission, it appears that the appropriate admission status for this patient is inpatient. This is judged to be reasonable and necessary in order to provide the required intensity of service to ensure the patient's safety given the presenting symptoms, physical exam findings, and initial radiographic and laboratory data in the context of their comorbid conditions. Patient requires inpatient status due to high intensity of service, high risk for further deterioration and high frequency of surveillance required.    Lucile Shutters MD [facility]     10/24/2020, 3:48 PM

## 2020-10-24 NOTE — Evaluation (Signed)
Clinical/Bedside Swallow Evaluation Patient Details  Name: Scott Montgomery MRN: [id number] Date of Birth: 1929-10-18  Today's Date: 10/24/2020 Time: SLP Start Time (ACUTE ONLY): 1040 SLP Stop Time (ACUTE ONLY): 1140 SLP Time Calculation (min) (ACUTE ONLY): 60 min  Past Medical History:  Past Medical History:  Diagnosis Date   Dementia (HCC)    Hypertension    Past Surgical History: History reviewed. No pertinent surgical history. HPI:  Pt  is a 87 y.o. male with medical history significant for Dementia -- lives in a facility, hyperlipidemia, BPH, hypertension, cataracts, history of colon polyps, generalized anxiety disorder, osteoarthritis, pseudophakia, presents to the emergency department from facility for chief concerns of altered mentation.  Initially at admit, he was alert to name, age. He was agitated attempting to take out his lines and IV requiring bilateral mittens for the confusion.  SW is attempting to place pt at a more skilled level facility which is taking a longer time - pt remains in the ED, not admitted.  During this time, NSG has reported some difficulty w/ swallowing last evening.  Pt has been seen by OT/PT Rehab.  CXR on 10/20/2020: No acute chest findings. 2. Unchanged left posterior sixth rib fracture.   Assessment / Plan / Recommendation Clinical Impression  This was a limitied assessment d/t pt's Drowsy/lethargic State presentation. Pt appears to present w/ oropharyngeal phase dysphagia in light of declined Cognitive status; drowsiness currently. This can impact his overall awareness/timing of swallow and safety during po tasks which increases risk for aspiration, choking. Risk for aspiration can be reduced when following general aspiration precautions, using a modified diet consistency w/ Nectar liquids, and when Supervised during oral intake.  This was discussed w/ MD/NSG who agreed.     He required mod-max verbal/tactile cues during po tasks to direct attention  to the trials and to follow through w/ oropharyngeal phase management of boluses, then swallowing. Pt was given only TSP trials of Nectar liquids w/ no immediate, overt clinical s/s of aspiration noted. However, oral phase deficits and suspected delay in pharyngeal swallow initiation were suspected. No decline in vocal quality; no cough, and no decline in respiratory status during/post trials noted. W/ the Nectar trials, oral phase was was c/b decreased attention to bolus management and timely A-P transfer of the boluses. Mod-max verbal cues given. No further consistencies attempted d/t pt's State presentation at the time. OM Exam revealed No unilateral weakness; lingual retraction present. Gag reflex diminished. Pt is missing few natural Dentition. Oral cavity slightly dry. Oral confusion noted during oral care.  D/t pt's Baseline, declined Cognitive status w/ Dementia and his risk for aspiration, recommend modifying the diet to a dysphagia level 1(puree) w/ Nectar liquids via TSP/Cup; Supervision w/ all oral intake -- giving po's only when fully alert/awake; aspiration precautions; reduce Distractions during meals and engage pt during po's at meal for self-feeding. Pills Crushed in Puree for safer swallowing. Support w/ feeding at meals as needed. Continue to monitor for s/s of aspiration w/ oral intake.  MD/NSG updated. ST services recommends follow w/ Palliative Care for GOC and education re: impact of Cognitive decline/Dementia on swallowing. ST services can follow pt at discharge for further education as needed and possible diet upgrade if appropriate. Largely suspect that pt's Dementia and Cognitive decilne could hamper upgrade of diet back to thin liquids, solids. Precautions posted in room. SLP Visit Diagnosis: Dysphagia, oropharyngeal phase (R13.12) (Dementia baseline)    Aspiration Risk  Moderate aspiration risk;Risk for inadequate nutrition/hydration  Diet Recommendation  Dysphagia level  1(puree) w/ Nectar liquids via TSP/Cup; Supervision w/ all oral intake -- giving po's only when fully alert/awake; aspiration precautions; reduce Distractions during meals and engage pt during po's at meal for self-feeding. Support w/ feeding at meals as needed.  Medication Administration: Crushed with puree    Other  Recommendations Recommended Consults:  (Dietician f/u; Palliative Care f/u for GOC) Oral Care Recommendations: Oral care BID;Oral care before and after PO;Staff/trained caregiver to provide oral care Other Recommendations: Order thickener from pharmacy;Prohibited food (jello, ice cream, thin soups);Remove water pitcher;Have oral suction available   Follow up Recommendations Skilled Nursing facility (TBD)      Frequency and Duration min 2x/week  2 weeks       Prognosis Prognosis for Safe Diet Advancement: Guarded Barriers to Reach Goals: Cognitive deficits;Language deficits;Time post onset;Severity of deficits;Behavior      Swallow Study   General Date of Onset: 10/16/20 HPI: Pt  is a 87 y.o. male with medical history significant for Dementia -- lives in a facility, hyperlipidemia, BPH, hypertension, cataracts, history of colon polyps, generalized anxiety disorder, osteoarthritis, pseudophakia, presents to the emergency department from facility for chief concerns of altered mentation.  Initially at admit, he was alert to name, age. He was agitated attempting to take out his lines and IV requiring bilateral mittens for the confusion.  SW is attempting to place pt at a more skilled level facility which is taking a longer time - pt remains in the ED, not admitted.  During this time, NSG has reported some difficulty w/ swallowing last evening.  Pt has been seen by OT/PT Rehab.  CXR on 10/20/2020: No acute chest findings. 2. Unchanged left posterior sixth rib fracture. Type of Study: Bedside Swallow Evaluation Previous Swallow Assessment: none Diet Prior to this Study: Regular;Thin  liquids Temperature Spikes Noted: No (wbc 10.1) Respiratory Status: Room air History of Recent Intubation: No Behavior/Cognition: Lethargic/Drowsy;Requires cueing Oral Cavity Assessment: Within Functional Limits (adequate) Oral Care Completed by SLP: Yes Oral Cavity - Dentition: Missing dentition (few) Vision:  (n/a) Self-Feeding Abilities: Total assist Patient Positioning: Upright in bed (needed full positioning support) Baseline Vocal Quality: Low vocal intensity (more mumbled speech, drowsy) Volitional Cough: Cognitively unable to elicit Volitional Swallow: Unable to elicit    Oral/Motor/Sensory Function Overall Oral Motor/Sensory Function:  (could not fully assess - gag reduced; tongue held in retracted position)   Ice Chips Ice chips: Not tested   Thin Liquid Thin Liquid: Not tested    Nectar Thick Nectar Thick Liquid: Impaired Presentation: Spoon (fed; 3 trials) Oral Phase Impairments: Reduced labial seal;Reduced lingual movement/coordination;Poor awareness of bolus Oral phase functional implications:  (anterior leakage) Pharyngeal Phase Impairments: Suspected delayed Swallow   Honey Thick Honey Thick Liquid: Not tested   Puree Puree: Not tested Other Comments: NSG reported adequate tolerance earlier when awake   Solid     Solid: Not tested        Jerilynn Som, MS, CCC-SLP Speech Language Pathologist Rehab Services [phone] [facility] 10/24/2020,12:07 PM

## 2020-10-24 NOTE — ED Notes (Signed)
Pt placed on [place] 3LPM

## 2020-10-24 NOTE — ED Provider Notes (Signed)
-----------------------------------------   5:21 AM on 10/24/2020 -----------------------------------------   Blood pressure (!) 193/71, pulse 70, temperature 98.5 F (36.9 C), temperature source Axillary, resp. rate 16, height 5\' 5"  (1.651 m), weight 50 kg, SpO2 97 %.  The patient is calm and cooperative at this time.  There have been no acute events since the last update.  Awaiting disposition plan from Social Work team.   , MD 10/24/20 [phone]

## 2020-10-24 NOTE — ED Notes (Signed)
X-ray at bedside

## 2020-10-25 ENCOUNTER — Encounter: Payer: Self-pay | Admitting: Internal Medicine

## 2020-10-25 ENCOUNTER — Other Ambulatory Visit: Payer: Self-pay

## 2020-10-25 DIAGNOSIS — F0391 Unspecified dementia with behavioral disturbance: Secondary | ICD-10-CM

## 2020-10-25 DIAGNOSIS — I10 Essential (primary) hypertension: Secondary | ICD-10-CM

## 2020-10-25 DIAGNOSIS — J690 Pneumonitis due to inhalation of food and vomit: Principal | ICD-10-CM

## 2020-10-25 DIAGNOSIS — G9341 Metabolic encephalopathy: Secondary | ICD-10-CM

## 2020-10-25 LAB — MRSA NEXT GEN BY PCR, NASAL: MRSA by PCR Next Gen: NOT DETECTED

## 2020-10-25 LAB — CBC
HCT: 34.2 % — ABNORMAL LOW (ref 39.0–52.0)
Hemoglobin: 11.4 g/dL — ABNORMAL LOW (ref 13.0–17.0)
MCH: 32.9 pg (ref 26.0–34.0)
MCHC: 33.3 g/dL (ref 30.0–36.0)
MCV: 98.8 fL (ref 80.0–100.0)
Platelets: 150 10*3/uL (ref 150–400)
RBC: 3.46 MIL/uL — ABNORMAL LOW (ref 4.22–5.81)
RDW: 12.5 % (ref 11.5–15.5)
WBC: 9 10*3/uL (ref 4.0–10.5)
nRBC: 0 % (ref 0.0–0.2)

## 2020-10-25 LAB — BASIC METABOLIC PANEL
Anion gap: 5 (ref 5–15)
BUN: 18 mg/dL (ref 8–23)
CO2: 30 mmol/L (ref 22–32)
Calcium: 8.7 mg/dL — ABNORMAL LOW (ref 8.9–10.3)
Chloride: 106 mmol/L (ref 98–111)
Creatinine, Ser: 0.85 mg/dL (ref 0.61–1.24)
GFR, Estimated: >60 mL/min (ref >60)
Glucose, Bld: 89 mg/dL (ref 70–99)
Potassium: 3.9 mmol/L (ref 3.5–5.1)
Sodium: 141 mmol/L (ref 135–145)

## 2020-10-25 LAB — AMMONIA: Ammonia: 12 umol/L (ref 9–35)

## 2020-10-25 LAB — VITAMIN B12: Vitamin B-12: 1651 pg/mL — ABNORMAL HIGH (ref 180–914)

## 2020-10-25 LAB — HIV ANTIBODY (ROUTINE TESTING W REFLEX): HIV Screen 4th Generation wRfx: NONREACTIVE

## 2020-10-25 NOTE — Plan of Care (Signed)
Pt arrived to unit this shift. He is agitated with skin assessment and tries to scratch and grab at the nurses. Bed alarm is on, bed low and locked, family updated on care. Problem: Pain Managment: Goal: General experience of comfort will improve Outcome: Progressing   Problem: Safety: Goal: Ability to remain free from injury will improve Outcome: Progressing   Problem: Skin Integrity: Goal: Risk for impaired skin integrity will decrease Outcome: Progressing

## 2020-10-25 NOTE — Progress Notes (Signed)
PROGRESS NOTE    Scott Montgomery  JYN:[id number] DOB: 07/08/1929 DOA: 10/20/2020 PCP: [facility]    Chief Complaint  Patient presents with   Fall    Brief Narrative:  87 year old gentleman prior history of hypertension hyperlipidemia, dementia, generalized anxiety disorder BPH recently discharged from the hospital to ALF presents to ED for unwitnessed fall.  Patient was also found to be hypoxic requiring 4 L of nasal cannula oxygen. CT of the head without contrast shows chronic small vessel ischemic changes. Chest x-ray showed bilateral aspiration pneumonia/infiltrates. He is being admitted to the hospitalist service for evaluation and management of aspiration pneumonia Assessment & Plan:   Principal Problem:   Aspiration pneumonia [facility]) Active Problems:   Fall   Essential hypertension   Dementia with behavioral disturbance (HCC)   Acute metabolic encephalopathy   Protein-calorie malnutrition, severe   Dysphagia causing pulmonary aspiration with swallowing   Aspiration pneumonia/acute respiratory failure with hypoxia initially requiring up to 4 L of nasal cannula oxygen to keep sats greater than 90%.  Chest x-ray showed bilateral infiltrates suggestive of possible aspiration/early pneumonia.  He was started on IV antibiotics, continue the same. WBC count has normalized with IV antibiotics SLP evaluation will be ordered.   Acute metabolic encephalopathy probably secondary to aspiration pneumonia in the setting/underlying dementia.  Unfortunately patient is lethargic and does not appear to be back to baseline yet and responds only to sternal rub at this time. Will get palliative care consult for goals of care discussion.     Protein calorie malnutrition probably secondary to underlying  dementia and poor oral intake   Falls probably secondary to deconditioning and debility.    Dementia with behavioral abnormalities  Reorientation and resume home  medication if able to take by mouth next 9    DVT prophylaxis: (Lovenox/) Code Status: (FullCode) Family Communication: none at bedside.  Disposition:   Status is: Inpatient  Remains inpatient appropriate because:Unsafe d/c plan and IV treatments appropriate due to intensity of illness or inability to take PO  Dispo: The patient is from: ALF              Anticipated d/c is to:  pending              Patient currently is not medically stable to d/c.   Difficult to place patient No       Consultants:  None.   Procedures: none.   Antimicrobials:  Antibiotics Given (last 72 hours)     Date/Time Action Medication Dose Rate   10/24/20 1455 New Bag/Given   ceFEPIme (MAXIPIME) 2 g in sodium chloride 0.9 % 100 mL IVPB 2 g 200 mL/hr   10/24/20 1549 New Bag/Given   vancomycin (VANCOREADY) IVPB 1250 mg/250 mL 1,250 mg 166.7 mL/hr   10/24/20 1730 New Bag/Given   Ampicillin-Sulbactam (UNASYN) 3 g in sodium chloride 0.9 % 100 mL IVPB 3 g 200 mL/hr   10/24/20 2245 New Bag/Given   Ampicillin-Sulbactam (UNASYN) 3 g in sodium chloride 0.9 % 100 mL IVPB 3 g 200 mL/hr   10/25/20 0415 New Bag/Given   Ampicillin-Sulbactam (UNASYN) 3 g in sodium chloride 0.9 % 100 mL IVPB 3 g 200 mL/hr   10/25/20 0944 New Bag/Given   Ampicillin-Sulbactam (UNASYN) 3 g in sodium chloride 0.9 % 100 mL IVPB 3 g 200 mL/hr         Subjective: No chest pain or sob.   Objective: Vitals:   10/24/20 2030 10/25/20 0300 10/25/20 0602  10/25/20 0831  BP: (!) 152/73  118/66 133/63  Pulse: (!) 56  77 77  Resp: 18  16 18   Temp: 97.8 F (36.6 C)  98.4 F (36.9 C) 98.6 F (37 C)  TempSrc:      SpO2: 96%  95% 94%  Weight:  57.4 kg    Height:  5\' 5"  (1.651 m)      Intake/Output Summary (Last 24 hours) at 10/25/2020 1202 Last data filed at 10/25/2020 0500 Gross per 24 hour  Intake 1909 ml  Output 500 ml  Net 1409 ml   Filed Weights   10/20/20 1919 10/25/20 0300  Weight: 50 kg 57.4 kg     Examination:  General exam: Appears calm and comfortable  Respiratory system: Clear to auscultation. Respiratory effort normal. Cardiovascular system: S1 & S2 heard, RRR. No pedal edema. Gastrointestinal system: Abdomen is nondistended, soft and nontender. . Normal bowel sounds heard. Central nervous system: lethargic,  Extremities: Symmetric 5 x 5 power. Skin: No rashes, lesions or ulcers Psychiatry:  cannot be assessed.     Data Reviewed: I have personally reviewed following labs and imaging studies  CBC: Recent Labs  Lab 10/20/20 2238 10/24/20 1344 10/25/20 0518  WBC 10.0 15.6* 9.0  NEUTROABS 7.0 13.4*  --   HGB 13.4 13.3 11.4*  HCT 39.8 39.7 34.2*  MCV 98.3 98.3 98.8  PLT 143* 175 150    Basic Metabolic Panel: Recent Labs  Lab 10/20/20 2238 10/24/20 1344 10/25/20 0518  NA 140 139 141  K 4.0 4.2 3.9  CL 103 102 106  CO2 27 29 30   GLUCOSE 123* 135* 89  BUN 27* 19 18  CREATININE 0.94 0.84 0.85  CALCIUM 9.1 9.1 8.7*    GFR: Estimated Creatinine Clearance: 47.8 mL/min (by C-G formula based on SCr of 0.85 mg/dL).  Liver Function Tests: Recent Labs  Lab 10/20/20 2238 10/24/20 1344  AST 131* 45*  ALT 67* 49*  ALKPHOS 41 43  BILITOT 1.0 1.2  PROT 6.5 6.1*  ALBUMIN 3.3* 2.8*    CBG: No results for input(s): GLUCAP in the last 168 hours.   Recent Results (from the past 240 hour(s))  Urine Culture     Status: Abnormal   Collection Time: 10/16/20  3:49 PM   Specimen: Urine, Random  Result Value Ref Range Status   Specimen Description   Final    URINE, RANDOM Performed at [facility], [address] [place], [place] [address]    Special Requests   Final    NONE Performed at [facility], [address] [place], [place] [address]    Culture >=100,000 COLONIES/mL STAPHYLOCOCCUS EPIDERMIDIS (A)  Final   Report Status 10/19/2020 FINAL  Final   Organism ID, Bacteria STAPHYLOCOCCUS EPIDERMIDIS (A)  Final      Susceptibility    Staphylococcus epidermidis - MIC*    CIPROFLOXACIN <=0.5 SENSITIVE Sensitive     GENTAMICIN <=0.5 SENSITIVE Sensitive     NITROFURANTOIN 32 SENSITIVE Sensitive     OXACILLIN <=0.25 SENSITIVE Sensitive     TETRACYCLINE <=1 SENSITIVE Sensitive     VANCOMYCIN 1 SENSITIVE Sensitive     TRIMETH/SULFA <=10 SENSITIVE Sensitive     CLINDAMYCIN <=0.25 SENSITIVE Sensitive     RIFAMPIN <=0.5 SENSITIVE Sensitive     Inducible Clindamycin NEGATIVE Sensitive     * >=100,000 COLONIES/mL STAPHYLOCOCCUS EPIDERMIDIS  Urine Culture     Status: Abnormal   Collection Time: 10/17/20  8:21 AM   Specimen: Urine, Clean  Catch  Result Value Ref Range Status   Specimen Description   Final    URINE, CLEAN CATCH Performed at [facility], [address] [place], [place] [address]    Special Requests   Final    NONE Performed at [facility], [address] [place], [place] [address]    Culture MULTIPLE SPECIES PRESENT, SUGGEST RECOLLECTION (A)  Final   Report Status 10/18/2020 FINAL  Final  Resp Panel by RT-PCR (Flu A&B, Covid) Nasopharyngeal Swab     Status: None   Collection Time: 10/17/20 10:53 AM   Specimen: Nasopharyngeal Swab; Nasopharyngeal(NP) swabs in vial transport medium  Result Value Ref Range Status   SARS Coronavirus 2 by RT PCR NEGATIVE NEGATIVE Final    Comment: (NOTE) SARS-CoV-2 target nucleic acids are NOT DETECTED.  The SARS-CoV-2 RNA is generally detectable in upper respiratory specimens during the acute phase of infection. The lowest concentration of SARS-CoV-2 viral copies this assay can detect is 138 copies/mL. A negative result does not preclude SARS-Cov-2 infection and should not be used as the sole basis for treatment or other patient management decisions. A negative result may occur with  improper specimen collection/handling, submission of specimen other than nasopharyngeal swab, presence of viral mutation(s) within the areas targeted by this assay,  and inadequate number of viral copies(<138 copies/mL). A negative result must be combined with clinical observations, patient history, and epidemiological information. The expected result is Negative.  Fact Sheet for Patients:  BloggerCourse.com  Fact Sheet for Healthcare Providers:  SeriousBroker.it  This test is no t yet approved or cleared by the [place] FDA and  has been authorized for detection and/or diagnosis of SARS-CoV-2 by FDA under an Emergency Use Authorization (EUA). This EUA will remain  in effect (meaning this test can be used) for the duration of the COVID-19 declaration under Section 564(b)(1) of the Act, 21 U.S.C.section 360bbb-3(b)(1), unless the authorization is terminated  or revoked sooner.       Influenza A by PCR NEGATIVE NEGATIVE Final   Influenza B by PCR NEGATIVE NEGATIVE Final    Comment: (NOTE) The Xpert Xpress SARS-CoV-2/FLU/RSV plus assay is intended as an aid in the diagnosis of influenza from Nasopharyngeal swab specimens and should not be used as a sole basis for treatment. Nasal washings and aspirates are unacceptable for Xpert Xpress SARS-CoV-2/FLU/RSV testing.  Fact Sheet for Patients: BloggerCourse.com  Fact Sheet for Healthcare Providers: SeriousBroker.it  This test is not yet approved or cleared by the [place] FDA and has been authorized for detection and/or diagnosis of SARS-CoV-2 by FDA under an Emergency Use Authorization (EUA). This EUA will remain in effect (meaning this test can be used) for the duration of the COVID-19 declaration under Section 564(b)(1) of the Act, 21 U.S.C. section 360bbb-3(b)(1), unless the authorization is terminated or revoked.  Performed at [facility], [address] [place], [place] [address]   Resp Panel by RT-PCR (Flu A&B, Covid) Nasopharyngeal Swab     Status: None    Collection Time: 10/20/20  1:53 PM   Specimen: Nasopharyngeal Swab; Nasopharyngeal(NP) swabs in vial transport medium  Result Value Ref Range Status   SARS Coronavirus 2 by RT PCR NEGATIVE NEGATIVE Final    Comment: (NOTE) SARS-CoV-2 target nucleic acids are NOT DETECTED.  The SARS-CoV-2 RNA is generally detectable in upper respiratory specimens during the acute phase of infection. The lowest concentration of SARS-CoV-2 viral copies this assay can detect is 138 copies/mL. A negative result does  not preclude SARS-Cov-2 infection and should not be used as the sole basis for treatment or other patient management decisions. A negative result may occur with  improper specimen collection/handling, submission of specimen other than nasopharyngeal swab, presence of viral mutation(s) within the areas targeted by this assay, and inadequate number of viral copies(<138 copies/mL). A negative result must be combined with clinical observations, patient history, and epidemiological information. The expected result is Negative.  Fact Sheet for Patients:  BloggerCourse.com  Fact Sheet for Healthcare Providers:  SeriousBroker.it  This test is no t yet approved or cleared by the [place] FDA and  has been authorized for detection and/or diagnosis of SARS-CoV-2 by FDA under an Emergency Use Authorization (EUA). This EUA will remain  in effect (meaning this test can be used) for the duration of the COVID-19 declaration under Section 564(b)(1) of the Act, 21 U.S.C.section 360bbb-3(b)(1), unless the authorization is terminated  or revoked sooner.       Influenza A by PCR NEGATIVE NEGATIVE Final   Influenza B by PCR NEGATIVE NEGATIVE Final    Comment: (NOTE) The Xpert Xpress SARS-CoV-2/FLU/RSV plus assay is intended as an aid in the diagnosis of influenza from Nasopharyngeal swab specimens and should not be used as a sole basis for treatment.  Nasal washings and aspirates are unacceptable for Xpert Xpress SARS-CoV-2/FLU/RSV testing.  Fact Sheet for Patients: BloggerCourse.com  Fact Sheet for Healthcare Providers: SeriousBroker.it  This test is not yet approved or cleared by the [place] FDA and has been authorized for detection and/or diagnosis of SARS-CoV-2 by FDA under an Emergency Use Authorization (EUA). This EUA will remain in effect (meaning this test can be used) for the duration of the COVID-19 declaration under Section 564(b)(1) of the Act, 21 U.S.C. section 360bbb-3(b)(1), unless the authorization is terminated or revoked.  Performed at [facility], [address] [place], [place] [address]   Resp Panel by RT-PCR (Flu A&B, Covid) Nasopharyngeal Swab     Status: None   Collection Time: 10/20/20  7:30 PM   Specimen: Nasopharyngeal Swab; Nasopharyngeal(NP) swabs in vial transport medium  Result Value Ref Range Status   SARS Coronavirus 2 by RT PCR NEGATIVE NEGATIVE Final    Comment: (NOTE) SARS-CoV-2 target nucleic acids are NOT DETECTED.  The SARS-CoV-2 RNA is generally detectable in upper respiratory specimens during the acute phase of infection. The lowest concentration of SARS-CoV-2 viral copies this assay can detect is 138 copies/mL. A negative result does not preclude SARS-Cov-2 infection and should not be used as the sole basis for treatment or other patient management decisions. A negative result may occur with  improper specimen collection/handling, submission of specimen other than nasopharyngeal swab, presence of viral mutation(s) within the areas targeted by this assay, and inadequate number of viral copies(<138 copies/mL). A negative result must be combined with clinical observations, patient history, and epidemiological information. The expected result is Negative.  Fact Sheet for Patients:   BloggerCourse.com  Fact Sheet for Healthcare Providers:  SeriousBroker.it  This test is no t yet approved or cleared by the [place] FDA and  has been authorized for detection and/or diagnosis of SARS-CoV-2 by FDA under an Emergency Use Authorization (EUA). This EUA will remain  in effect (meaning this test can be used) for the duration of the COVID-19 declaration under Section 564(b)(1) of the Act, 21 U.S.C.section 360bbb-3(b)(1), unless the authorization is terminated  or revoked sooner.       Influenza A by PCR NEGATIVE NEGATIVE Final  Influenza B by PCR NEGATIVE NEGATIVE Final    Comment: (NOTE) The Xpert Xpress SARS-CoV-2/FLU/RSV plus assay is intended as an aid in the diagnosis of influenza from Nasopharyngeal swab specimens and should not be used as a sole basis for treatment. Nasal washings and aspirates are unacceptable for Xpert Xpress SARS-CoV-2/FLU/RSV testing.  Fact Sheet for Patients: BloggerCourse.com  Fact Sheet for Healthcare Providers: SeriousBroker.it  This test is not yet approved or cleared by the [place] FDA and has been authorized for detection and/or diagnosis of SARS-CoV-2 by FDA under an Emergency Use Authorization (EUA). This EUA will remain in effect (meaning this test can be used) for the duration of the COVID-19 declaration under Section 564(b)(1) of the Act, 21 U.S.C. section 360bbb-3(b)(1), unless the authorization is terminated or revoked.  Performed at [facility], [address] [place], [place] [address]   Blood culture (routine x 2)     Status: None (Preliminary result)   Collection Time: 10/24/20  1:34 PM   Specimen: BLOOD  Result Value Ref Range Status   Specimen Description BLOOD BLOOD RIGHT WRIST  Final   Special Requests   Final    BOTTLES DRAWN AEROBIC AND ANAEROBIC Blood Culture adequate volume   Culture    Final    NO GROWTH < 24 HOURS Performed at [facility], [address], [place], [place] [address]    Report Status PENDING  Incomplete  Blood culture (routine x 2)     Status: None (Preliminary result)   Collection Time: 10/24/20  1:35 PM   Specimen: BLOOD  Result Value Ref Range Status   Specimen Description BLOOD BLOOD RIGHT FOREARM  Final   Special Requests   Final    BOTTLES DRAWN AEROBIC AND ANAEROBIC Blood Culture results may not be optimal due to an inadequate volume of blood received in culture bottles   Culture   Final    NO GROWTH < 24 HOURS Performed at [facility], [address] [place], [place] [address]    Report Status PENDING  Incomplete  MRSA Next Gen by PCR, Nasal     Status: None   Collection Time: 10/25/20  4:25 AM   Specimen: Nasal Mucosa; Nasal Swab  Result Value Ref Range Status   MRSA by PCR Next Gen NOT DETECTED NOT DETECTED Final    Comment: (NOTE) The GeneXpert MRSA Assay (FDA approved for NASAL specimens only), is one component of a comprehensive MRSA colonization surveillance program. It is not intended to diagnose MRSA infection nor to guide or monitor treatment for MRSA infections. Test performance is not FDA approved in patients less than 85 years old. Performed at [facility], [address] [place], [place] [address]          Radiology Studies: DG Chest Portable 1 View  Result Date: 10/24/2020 CLINICAL DATA:  Cough, aspiration, hypoxia. EXAM: PORTABLE CHEST 1 VIEW COMPARISON:  10/20/2020 FINDINGS: Single view of the chest was obtained. Again noted is slight elevation of left hemidiaphragm. Hazy densities in lower lungs. Heart size is within normal limits and stable. Again noted is a displaced fracture involving the left sixth rib. Negative for a pneumothorax. IMPRESSION: Hazy densities at both lung bases. Findings could represent atelectasis but based on history of aspiration, findings also could represent  early pneumonia. Consider follow-up imaging. Electronically Signed   By: Richarda Overlie M.D.   On: 10/24/2020 13:59        Scheduled Meds:  enoxaparin (LOVENOX) injection  40 mg Subcutaneous Q24H   sodium chloride flush  3 mL Intravenous Q12H   Continuous Infusions:  sodium chloride     ampicillin-sulbactam (UNASYN) IV 3 g (10/25/20 0944)   lactated ringers 125 mL/hr at 10/25/20 0720     LOS: 1 day        Kathlen Mody, MD [facility]   To contact the attending provider between 7A-7P or the covering provider during after hours 7P-7A, please log into the web site [email or web address] and access using universal [place] password for that web site. If you do not have the password, please call the hospital operator.  10/25/2020, 12:02 PM

## 2020-10-26 ENCOUNTER — Encounter: Payer: Self-pay | Admitting: Internal Medicine

## 2020-10-26 DIAGNOSIS — F0390 Unspecified dementia without behavioral disturbance: Secondary | ICD-10-CM

## 2020-10-26 DIAGNOSIS — Z515 Encounter for palliative care: Secondary | ICD-10-CM

## 2020-10-26 DIAGNOSIS — W19XXXS Unspecified fall, sequela: Secondary | ICD-10-CM

## 2020-10-26 DIAGNOSIS — Z7189 Other specified counseling: Secondary | ICD-10-CM

## 2020-10-26 LAB — BASIC METABOLIC PANEL
Anion gap: 7 (ref 5–15)
BUN: 17 mg/dL (ref 8–23)
CO2: 28 mmol/L (ref 22–32)
Calcium: 8.7 mg/dL — ABNORMAL LOW (ref 8.9–10.3)
Chloride: 105 mmol/L (ref 98–111)
Creatinine, Ser: 0.76 mg/dL (ref 0.61–1.24)
GFR, Estimated: >60 mL/min (ref >60)
Glucose, Bld: 71 mg/dL (ref 70–99)
Potassium: 3.7 mmol/L (ref 3.5–5.1)
Sodium: 140 mmol/L (ref 135–145)

## 2020-10-26 LAB — CBC
HCT: 35.8 % — ABNORMAL LOW (ref 39.0–52.0)
Hemoglobin: 11.8 g/dL — ABNORMAL LOW (ref 13.0–17.0)
MCH: 32.1 pg (ref 26.0–34.0)
MCHC: 33 g/dL (ref 30.0–36.0)
MCV: 97.3 fL (ref 80.0–100.0)
Platelets: 152 10*3/uL (ref 150–400)
RBC: 3.68 MIL/uL — ABNORMAL LOW (ref 4.22–5.81)
RDW: 12.1 % (ref 11.5–15.5)
WBC: 8.4 10*3/uL (ref 4.0–10.5)
nRBC: 0 % (ref 0.0–0.2)

## 2020-10-26 MED ORDER — FLUOXETINE HCL 20 MG PO CAPS
20.0000 mg | ORAL_CAPSULE | Freq: Every day | ORAL | Status: DC
  Administered 2020-10-27 – 2020-11-04 (×9): 20 mg via ORAL
  Filled 2020-10-26 (×11): qty 1

## 2020-10-26 MED ORDER — AMOXICILLIN-POT CLAVULANATE 875-125 MG PO TABS: 1.0000 | ORAL_TABLET | Freq: Two times a day (BID) | ORAL | Status: DC

## 2020-10-26 MED ORDER — TAMSULOSIN HCL 0.4 MG PO CAPS
0.4000 mg | ORAL_CAPSULE | Freq: Every day | ORAL | Status: DC
  Administered 2020-10-27 – 2020-11-04 (×8): 0.4 mg via ORAL
  Filled 2020-10-26 (×8): qty 1

## 2020-10-26 MED ORDER — VITAMIN B-12 1000 MCG PO TABS
1000.0000 ug | ORAL_TABLET | Freq: Every day | ORAL | Status: DC
  Administered 2020-10-27 – 2020-11-04 (×9): 1000 ug via ORAL
  Filled 2020-10-26 (×9): qty 1

## 2020-10-26 MED ORDER — SENNOSIDES-DOCUSATE SODIUM 8.6-50 MG PO TABS
2.0000 | ORAL_TABLET | Freq: Two times a day (BID) | ORAL | Status: DC
  Administered 2020-10-26 – 2020-11-04 (×14): 2 via ORAL
  Filled 2020-10-26 (×16): qty 2

## 2020-10-26 MED ORDER — VITAMIN D 25 MCG (1000 UNIT) PO TABS
2000.0000 [IU] | ORAL_TABLET | Freq: Every day | ORAL | Status: DC
  Administered 2020-10-27 – 2020-11-04 (×9): 2000 [IU] via ORAL
  Filled 2020-10-26 (×9): qty 2

## 2020-10-26 MED ORDER — SODIUM CHLORIDE 0.9 % IV SOLN
3.0000 g | Freq: Four times a day (QID) | INTRAVENOUS | Status: DC
  Administered 2020-10-26 – 2020-10-27 (×4): 3 g via INTRAVENOUS
  Filled 2020-10-26: qty 8
  Filled 2020-10-26: qty 3
  Filled 2020-10-26 (×4): qty 8

## 2020-10-26 MED ORDER — DIVALPROEX SODIUM 125 MG PO CSDR
125.0000 mg | DELAYED_RELEASE_CAPSULE | Freq: Two times a day (BID) | ORAL | Status: DC
  Administered 2020-10-26 – 2020-11-04 (×18): 125 mg via ORAL
  Filled 2020-10-26 (×21): qty 1

## 2020-10-26 MED ORDER — HYDRALAZINE HCL 20 MG/ML IJ SOLN
10.0000 mg | Freq: Four times a day (QID) | INTRAMUSCULAR | Status: DC | PRN
  Administered 2020-10-26: 10 mg via INTRAVENOUS
  Filled 2020-10-26: qty 1

## 2020-10-26 MED ORDER — RISPERIDONE 1 MG PO TABS
1.0000 mg | ORAL_TABLET | Freq: Every day | ORAL | Status: DC
  Administered 2020-10-26 – 2020-10-29 (×4): 1 mg via ORAL
  Filled 2020-10-26 (×5): qty 1

## 2020-10-26 MED ORDER — METHYLPHENIDATE HCL 5 MG PO TABS
5.0000 mg | ORAL_TABLET | Freq: Two times a day (BID) | ORAL | Status: DC
  Administered 2020-10-26 – 2020-11-04 (×19): 5 mg via ORAL
  Filled 2020-10-26 (×19): qty 1

## 2020-10-26 NOTE — Progress Notes (Signed)
Occupational Therapy Treatment Patient Details Name: Scott Montgomery MRN: [id number] DOB: 03-18-1930 Today's Date: 10/26/2020    History of present illness Scott Montgomery is a 87 y.o. male with dementia who comes in from ALF after a fall.  Patient was discharged from [facility] to [facility] on 10/20/20 after workup for UTI.  Upon arrival to [facility] within a 1 hour period patient had  unwitnessed fall. Staff brought him back to the ED for evaluation. Imaging negative for acute abnormality.   OT comments  Chart reviewed, pt greeted in room with NT present for bathing tasks. Pt is A&Ox 0, however agreeable to OT tx session. Pt is responsive to his name, however does not state name when prompted. Pt requires MAX A for UB bathing, dressing. Pt with poor sequencing of ADL tasks without maximum verbal and tactile prompting. Pt requires MOD A for supine>sit, CGA for sit>supine. STS with gait belt with MIN A. Cognition is affecting independent and safe ADL completion and ADL mobility. Pt is left in care of NT, NAD, all needs met. Discharge recommendation remains appropriate, OT will continue to follow while admitted.    Follow Up Recommendations  SNF;Supervision/Assistance - 24 hour    Equipment Recommendations  Other (comment) (per next venue of care)    Recommendations for Other Services      Precautions / Restrictions Precautions Precautions: Fall Restrictions Weight Bearing Restrictions: No       Mobility Bed Mobility Overal bed mobility: Needs Assistance Bed Mobility: Supine to Sit;Sit to Supine;Rolling Rolling: Supervision   Supine to sit: Mod assist Sit to supine: Min guard        Transfers Overall transfer level: Needs assistance Equipment used: 2 person hand held assist Transfers: Sit to/from Stand Sit to Stand: Min assist         General transfer comment: Maintains standing for approx 3 minutes for peri care, sheet change    Balance Overall  balance assessment: Needs assistance Sitting-balance support: Bilateral upper extremity supported;Feet supported Sitting balance-Leahy Scale: Fair   Postural control: Posterior lean Standing balance support: No upper extremity supported Standing balance-Leahy Scale: Poor                             ADL either performed or assessed with clinical judgement   ADL Overall ADL's : Needs assistance/impaired         Upper Body Bathing: Maximal assistance;Sitting Upper Body Bathing Details (indicate cue type and reason): at edge of bed     Upper Body Dressing : Maximal assistance           Toileting- Clothing Manipulation and Hygiene: Maximal assistance Toileting - Clothing Manipulation Details (indicate cue type and reason): for peri care       General ADL Comments: Pt requires step by step vcs for one step directives during ADL task completion     Vision Baseline Vision/History: Wears glasses     Perception     Praxis      Cognition Arousal/Alertness: Awake/alert Behavior During Therapy: Impulsive Overall Cognitive Status: History of cognitive impairments - at baseline                                 General Comments: Pt is A&O x0. Pleasant and agreeable to paricipation in ADL task, however significant cueing required for one step directive following  Exercises     Shoulder Instructions       General Comments      Pertinent Vitals/ Pain       Pain Assessment: No/denies pain  Home Living                                          Prior Functioning/Environment              Frequency  Min 1X/week        Progress Toward Goals  OT Goals(current goals can now be found in the care plan section)  Progress towards OT goals: Progressing toward goals  Acute Rehab OT Goals Patient Stated Goal: to lay down OT Goal Formulation: With patient Time For Goal Achievement: 11/09/20 Potential to Achieve  Goals: Scott Montgomery Discharge plan remains appropriate;Frequency remains appropriate    Co-evaluation                 AM-PAC OT "6 Clicks" Daily Activity     Outcome Measure   Help from another person eating meals?: A Little Help from another person taking care of personal grooming?: A Lot Help from another person toileting, which includes using toliet, bedpan, or urinal?: A Lot Help from another person bathing (including washing, rinsing, drying)?: A Lot Help from another person to put on and taking off regular upper body clothing?: A Lot Help from another person to put on and taking off regular lower body clothing?: A Lot 6 Click Score: 13    End of Session Equipment Utilized During Treatment: Gait belt  OT Visit Diagnosis: Other abnormalities of gait and mobility (R26.89);Muscle weakness (generalized) (M62.81);Repeated falls (R29.6)   Activity Tolerance Patient tolerated treatment well   Patient Left in bed;with call bell/phone within reach;with nursing/sitter in room   Nurse Communication Mobility status        Time: 9509-3267 OT Time Calculation (min): 14 min  Charges: OT General Charges $OT Visit: 1 Visit OT Treatments $Self Care/Home Management : 8-22 mins  Shanon Payor, OTD OTR/L  10/26/20, 11:40 AM

## 2020-10-26 NOTE — Progress Notes (Signed)
PT Cancellation Note  Patient Details Name: Scott Montgomery MRN: [id number] DOB: 16-Aug-1929   Cancelled Treatment:    Reason Eval/Treat Not Completed: Patient declined, no reason specified. Pt remains asleep and will not open eyes to voice or light touch. PT not overly aggressive in OOB mobility attempts due to history of dementia related combativeness. Will re-attempt another date and time as available.    Delphia Grates. Fairly IV, PT, DPT Physical Therapist- [place]  [facility]  10/26/2020, 2:31 PM

## 2020-10-26 NOTE — Progress Notes (Signed)
[facility]  - [place] [facility]   PATIENT NAME: Scott Montgomery    MR#:  [id number]  DATE OF BIRTH:  [birth year]  SUBJECTIVE:  patient noncompliant to taking overall meds. Has been refusing PT. Resting quietly. Not much conversation. No family at bedside. Poor PO intake according to RN.  REVIEW OF SYSTEMS:   Review of Systems  Unable to perform ROS: Medical condition  Tolerating Diet: no Tolerating PT: patient refusing, not participating  DRUG ALLERGIES:  No Known Allergies  VITALS:  Blood pressure (!) 183/85, pulse 65, temperature 98.6 F (37 C), temperature source Oral, resp. rate 17, height 5\' 5"  (1.651 m), weight 57.4 kg, SpO2 97 %.  PHYSICAL EXAMINATION:   Physical Exam limited exam  GENERAL:  87 y.o.-year-old patient lying in the bed with no acute distress. Appears chronically ill, deconditioned LUNGS: Normal breath sounds bilaterally, no wheezing, rales, rhonchi. No use of accessory muscles of respiration.  CARDIOVASCULAR: S1, S2 normal. No murmurs, rubs, or gallops.  ABDOMEN: Soft, nontender, nondistended.  EXTREMITIES: No cyanosis, clubbing or edema b/l.    NEUROLOGIC: opens eyes to verbal command. Does not converse much. Somewhat lethargic PSYCHIATRIC:  patient lethargic not much converses SKIN: No obvious rash, lesion, or ulcer.  Pressure Injury 10/18/20 Arm Lower;Posterior;Right Deep Tissue Pressure Injury - Purple or maroon localized area of discolored intact skin or blood-filled blister due to damage of underlying soft tissue from pressure and/or shear. (Active)  10/18/20 2000  Location: Arm  Location Orientation: Lower;Posterior;Right (forearm)  Staging: Deep Tissue Pressure Injury - Purple or maroon localized area of discolored intact skin or blood-filled blister due to damage of underlying soft tissue from pressure and/or shear. (per Wound care note 10/18/20)  Wound Description (Comments):   Present on Admission: Yes        LABORATORY  PANEL:  CBC Recent Labs  Lab 10/26/20 0442  WBC 8.4  HGB 11.8*  HCT 35.8*  PLT 152    Chemistries  Recent Labs  Lab 10/24/20 1344 10/25/20 0518 10/26/20 0442  NA 139   < > 140  K 4.2   < > 3.7  CL 102   < > 105  CO2 29   < > 28  GLUCOSE 135*   < > 71  BUN 19   < > 17  CREATININE 0.84   < > 0.76  CALCIUM 9.1   < > 8.7*  AST 45*  --   --   ALT 49*  --   --   ALKPHOS 43  --   --   BILITOT 1.2  --   --    < > = values in this interval not displayed.   Cardiac Enzymes No results for input(s): TROPONINI in the last 168 hours. RADIOLOGY:  No results found. ASSESSMENT AND PLAN:   87 y.o. male  with past medical history of HTN/HLD, dementia, BPH, anxiety, recently discharged from hospital to ALF after admission for a fall with rhabdomyolysis and dementia with behavior disturbance admitted on 10/20/2020 with aspiration pneumonia, fall, protein calorie malnutrition likely secondary to underlying dementia and poor by mouth intake.  Aspiration pneumonia with acute hypoxic respiratory failure -- patient currently on room air. No respiratory distress. -- will start pured diet -- IV unasyn for now since patient is not taking PO meds -- white count normal.  acute metabolic encephalopathy secondary to underlying dementia with aspiration pneumonia. -- patient unable to hold meaningful conversation remains lethargic   failure to thrive protein  calorie malnutrition poor PO intake Falls -- overall has a poor prognosis. Patient seen by palliative care. -- they have discussed with patient's wife Scott Montgomery -- patient is DNR -- wife understands poor prognosis. -- she is considering hospice at discharge.  ffalls secondary to generalized ability and deconditioning -- patient is not participating with PT. Suspect this is due to -- I have resumed most of patient's home meds. He has been refusing oral meds    Procedures: Family communication : Consults : palliative care CODE  STATUS: DNR DNI DVT Prophylaxis : enoxaparin Level of care: Med-Surg Status is: Inpatient  Remains inpatient appropriate because:Inpatient level of care appropriate due to severity of illness  Dispo: The patient is from: ALF              Anticipated d/c is to:  TBD              Patient currently is not medically stable to d/c.   Difficult to place patient No        TOTAL TIME TAKING CARE OF THIS PATIENT: 25 minutes.  >50% time spent on counselling and coordination of care  Note: This dictation was prepared with Dragon dictation along with smaller phrase technology. Any transcriptional errors that result from this process are unintentional.  Scott Montgomery M.D    [facility]   CC: Primary care physician; [facility] Patient ID: Scott Montgomery, male   DOB: Feb 18, 1930, 87 y.o.   MRN: [id number]

## 2020-10-26 NOTE — Consult Note (Signed)
Consultation Note Date: 10/26/2020   Patient Name: Scott Montgomery  DOB: 02-10-1930  MRN: [id number]  Age / Sex: 87 y.o., male  PCP: [facility] Referring Physician: Enedina Finner, MD  Reason for Consultation: Establishing goals of care  HPI/Patient Profile: 87 y.o. male  with past medical history of HTN/HLD, dementia, BPH, anxiety, recently discharged from hospital to ALF after admission for a fall with rhabdomyolysis and dementia with behavior disturbance admitted on 10/20/2020 with aspiration pneumonia, fall, protein calorie malnutrition likely secondary to underlying dementia and poor by mouth intake.   Clinical Assessment and Goals of Care: I have reviewed medical records including EPIC notes, labs and imaging, received report from RN, assessed the patient.  Scott Montgomery is lying quietly in bed.   He will briefly make but not  keep eye contact.  He is able to tell me his name, and that we are in the hospital, but not the month.  He tells me that he is here because he could not breathe due to "the atmosphere".  I am not sure that he can make his basic needs known.  There is no family at bedside at this time.  I ask about his wife, Scott Montgomery, who is listed in emergency contacts and he tells me that they are divorced.  He tells me that he has a brother, Scott Montgomery.  Chart review from previous palliative consult shows that Scott Montgomery is indeed married to [place]. Call to Scott Montgomery, wife of 15 years, to discuss diagnosis prognosis, GOC, EOL wishes, disposition and options.  Not known anybody in months and months. I know about hospice and they are wonderful.   I introduced Palliative Medicine as specialized medical care for people living with serious illness. It focuses on providing relief from the symptoms and stress of a serious illness. The goal is to improve quality of life for both the patient and the  family.  We discussed a brief life review of the patient and then focused on their current illness. The natural disease trajectory and expectations at EOL were discussed.  We talked about memory loss/dementia as a progressive illness.   I attempted to elicit values and goals of care important to the patient.  Scott Montgomery shares that Scott Montgomery has no known family for many months now.  She states that he does not recognize his loved ones.  The difference between aggressive medical intervention and comfort care was considered in light of the patient's goals of care.  We talked about prolonging life, but also prolonging suffering.  We talked about Scott Montgomery suffering as he no longer recognizes loved ones, and cannot interact with him in meaningful ways.  Advanced directives, concepts specific to code status, artifical feeding and hydration, and rehospitalization were considered and discussed.  Scott Montgomery states that they have DNR in place, and would not want tube feeding or artificial hydration keeping Scott Montgomery alive.  Hospice and Palliative Care services outpatient were explained and offered.  Scott Montgomery is agreeable to discharge with hospice care.  At this point she would like him transferred to ALF if she can be accepted.  [facility]  Discussed the importance of continued conversation with family and the medical providers regarding overall plan of care and treatment options, ensuring decisions are within the context of the patient's values and GOCs. Questions and concerns were addressed.  The family was encouraged to call with questions or concerns.  PMT will continue to support holistically.  Conference with attending, bedside nursing staff, transition of care team related to patient condition, needs, goals of care, disposition, hospice care. PMT to continue to follow.  HCPOA   NEXT OF KIN -wife of 15 years, Scott Montgomery    SUMMARY OF RECOMMENDATIONS   At  this point continue to treat the treatable but no CPR or intubation Hospice care at memory care unit.  Code Status/Advance Care Planning: DNR  Symptom Management:  Per hospitalist, no additional needs at this time.  Palliative Prophylaxis:  Frequent Pain Assessment, Oral Care, and Turn Reposition  Additional Recommendations (Limitations, Scope, Preferences): At this point continue to treat the treatable but no CPR or intubation.  Psycho-social/Spiritual:  Desire for further Chaplaincy support:no Additional Recommendations: Caregiving  Support/Resources  Prognosis:  Unable to determine, 3 to 6 months or less would not be surprising based on frailty, poor functional status, poor by mouth intake, advancing chronic illness burden.  Discharge Planning:  Attempting for memory care placement with the benefits of hospice care.       Primary Diagnoses: Present on Admission:  Aspiration pneumonia (HCC)  Dementia with behavioral disturbance (HCC)  Essential hypertension  Fall  Protein-calorie malnutrition, severe  Acute metabolic encephalopathy  Dysphagia causing pulmonary aspiration with swallowing   I have reviewed the medical record, interviewed the patient and family, and examined the patient. The following aspects are pertinent.  Past Medical History:  Diagnosis Date   Dementia (HCC)    Hypertension    Social History   Socioeconomic History   Marital status: Married    Spouse name: Not on file   Number of children: Not on file   Years of education: Not on file   Highest education level: Not on file  Occupational History   Not on file  Tobacco Use   Smoking status: Unknown   Smokeless tobacco: Not on file  Vaping Use   Vaping Use: Unknown  Substance and Sexual Activity   Alcohol use: Not Currently   Drug use: Never   Sexual activity: Not Currently  Other Topics Concern   Not on file  Social History Narrative   Not on file   Social Determinants of Health    Financial Resource Strain: Not on file  Food Insecurity: Not on file  Transportation Needs: Not on file  Physical Activity: Not on file  Stress: Not on file  Social Connections: Not on file   History reviewed. No pertinent family history. Scheduled Meds:  amoxicillin-clavulanate  1 tablet Oral Q12H   cholecalciferol  2,000 Units Oral Daily   divalproex  125 mg Oral BID   enoxaparin (LOVENOX) injection  40 mg Subcutaneous Q24H   FLUoxetine  20 mg Oral Daily   methylphenidate  5 mg Oral BID   risperiDONE  1 mg Oral QHS   senna-docusate  2 tablet Oral BID   sodium chloride flush  3 mL Intravenous Q12H   tamsulosin  0.4 mg Oral Daily   vitamin B-12  1,000 mcg Oral Daily   Continuous  Infusions:  sodium chloride     PRN Meds:.sodium chloride, sodium chloride flush Medications Prior to Admission:  Prior to Admission medications   Medication Sig Start Date End Date Taking? Authorizing Provider  acetaminophen (TYLENOL) 325 MG tablet Take 650 mg by mouth 3 (three) times daily.   Yes [provider]  cholecalciferol (VITAMIN D) 25 MCG (1000 UNIT) tablet Take 2,000 Units by mouth daily.   Yes [provider]  divalproex (DEPAKOTE SPRINKLE) 125 MG capsule Take 125 mg by mouth 2 (two) times daily.   Yes [provider]  FLUoxetine (PROZAC) 20 MG capsule Take 20 mg by mouth daily.   Yes [provider]  methylphenidate (RITALIN) 5 MG tablet Take 5 mg by mouth 2 (two) times daily.   Yes [provider]  risperiDONE (RISPERDAL) 1 MG tablet Take 1 mg by mouth at bedtime.   Yes [provider]  senna-docusate (SENOKOT-S) 8.6-50 MG tablet Take 2 tablets by mouth 2 (two) times daily.   Yes [provider]  tamsulosin (FLOMAX) 0.4 MG CAPS capsule Take 0.4 mg by mouth.   Yes [provider]  traZODone (DESYREL) 100 MG tablet Take 100 mg by mouth at bedtime.   Yes [provider]  vitamin B-12 (CYANOCOBALAMIN) 1000 MCG  tablet Take 1,000 mcg by mouth daily.   Yes [provider]   No Known Allergies Review of Systems  Unable to perform ROS: Acuity of condition   Physical Exam Vitals and nursing note reviewed.  Constitutional:      General: He is not in acute distress.    Appearance: He is ill-appearing.  HENT:     Mouth/Throat:     Mouth: Mucous membranes are moist.  Cardiovascular:     Rate and Rhythm: Normal rate.  Pulmonary:     Effort: Pulmonary effort is normal. No respiratory distress.  Skin:    General: Skin is warm and dry.  Neurological:     Mental Status: He is alert.     Comments: Oriented to person and place, situation but not time  Psychiatric:     Comments: Calm and cooperative    Vital Signs: BP (!) 175/74 (BP Location: Left Arm)   Pulse 62   Temp 97.8 F (36.6 C) (Oral)   Resp 19   Ht 5\' 5"  (1.651 m)   Wt 57.4 kg   SpO2 97%   BMI 21.05 kg/m  Pain Scale: Faces   Pain Score: 10-Worst pain ever   SpO2: SpO2: 97 % O2 Device:SpO2: 97 % O2 Flow Rate: .O2 Flow Rate (L/min): 2 L/min  IO: Intake/output summary:  Intake/Output Summary (Last 24 hours) at 10/26/2020 1454 Last data filed at 10/26/2020 12/26/2020 Gross per 24 hour  Intake 3880.75 ml  Output 800 ml  Net 3080.75 ml    LBM: Last BM Date: 10/19/20 Baseline Weight: Weight: 50 kg Most recent weight: Weight: 57.4 kg     Palliative Assessment/Data:   Flowsheet Rows    Flowsheet Row Most Recent Value  Intake Tab   Referral Department Hospitalist  Unit at Time of Referral Med/Surg Unit  Palliative Care Primary Diagnosis Pulmonary  Date Notified 10/24/20  Palliative Care Type Return patient Palliative Care  Reason for referral Clarify Goals of Care  Date of Admission 10/20/20  Date first seen by Palliative Care 10/26/20  # of days Palliative referral response time 2 Day(s)  # of days IP prior to Palliative referral 4  Clinical Assessment   Palliative Performance  Scale Score 40%  Pain Max last 24  hours Not able to report  Pain Min Last 24 hours Not able to report  Dyspnea Max Last 24 Hours Not able to report  Dyspnea Min Last 24 hours Not able to report  Psychosocial & Spiritual Assessment   Palliative Care Outcomes        Time In: 1430  Time Out: 1540 Time Total: 70 minutes  Greater than 50%  of this time was spent counseling and coordinating care related to the above assessment and plan.  Signed by: Katheran Aweasha A Karry Barrilleaux, NP   Please contact Palliative Medicine Team phone at [phone] for questions and concerns.  For individual provider: See Loretha StaplerAmion

## 2020-10-26 NOTE — Progress Notes (Signed)
  Speech Language Pathology Treatment: Dysphagia  Patient Details Name: Scott Montgomery MRN: [id number] DOB: [birth year] Today's Date: 10/26/2020 Time: 0940-1000 SLP Time Calculation (min) (ACUTE ONLY): 20 min  Assessment / Plan / Recommendation Clinical Impression  Pt seen for ongoing dysphagia therapy. [name] most recent chest x-ray on 10/24/2020 revealed hazy densities at both lung bases. findings could represent atelectasis but based on history of aspiration, findings also could represent early pneumonia. This appears worsened from chest x-ray on 10/20/2020 that was negative for any acute findings.   Skilled observed provided of pt consuming minimal trials of ice chips, thin liquids via cup, nectar thick liquids vi cup and puree. Pt was self-limiting in the amount of trials that he consumed as he refused any further trials beyond 2 bites/boluses of each. Suspect this is likely attributed to pt's dementia.   When consuming the above trials, pt was free of overt s/s of aspiration. However, given his most recent chest x-ray was more concerning, recommend a conservative diet of dysphagia 1 with nectar thick liquids via cup, medicine crushed in puree.   ST to follow pt for diet toleration as well as possible diet advancement.     HPI HPI: Pt  is a 87 y.o. male with medical history significant for Dementia -- lives in a facility, hyperlipidemia, BPH, hypertension, cataracts, history of colon polyps, generalized anxiety disorder, osteoarthritis, pseudophakia, presents to the emergency department from facility for chief concerns of altered mentation.  Initially at admit, he was alert to name, age. He was agitated attempting to take out his lines and IV requiring bilateral mittens for the confusion.  SW is attempting to place pt at a more skilled level facility which is taking a longer time - pt remains in the ED, not admitted.  During this time, NSG has reported some difficulty w/ swallowing last  evening.  Pt has been seen by OT/PT Rehab.  CXR on 10/20/2020: No acute chest findings. 2. Unchanged left posterior sixth rib fracture.      SLP Plan  Continue with current plan of care       Recommendations  Diet recommendations: Dysphagia 1 (puree);Nectar-thick liquid;Thin liquid Liquids provided via: Cup Medication Administration: Crushed with puree Supervision: Staff to assist with self feeding;Full supervision/cueing for compensatory strategies Compensations: Minimize environmental distractions;Slow rate;Small sips/bites Postural Changes and/or Swallow Maneuvers: Seated upright 90 degrees;Upright 30-60 min after meal                Oral Care Recommendations: Oral care BID Follow up Recommendations: Skilled Nursing facility SLP Visit Diagnosis: Dysphagia, oropharyngeal phase (R13.12) Plan: Continue with current plan of care       GO               Scott Montgomery B. Dreama Saa M.S., CCC-SLP, [facility] Speech-Language Pathologist Rehabilitation Services Office [phone]  Scott Montgomery Dreama Saa 10/26/2020, 10:01 AM

## 2020-10-26 NOTE — TOC Progression Note (Addendum)
Transition of Care [facility]) - Progression Note    Patient Details  Name: Scott Montgomery MRN: [id number] Date of Birth: Oct 26, 1929  [facility] CM/SW Contact  Hillsdale Cellar, RN Phone Number: 10/26/2020, 1:33 PM  Clinical Narrative:    Sherron Monday to [facility] Phone: [phone] states he has not received any information for review and confirmed correct fax number is [phone]. Refaxed information for review.   Call to [facility] @ [phone] requesting availability and confirming receipt of clinical information. Confirmed with [name] availability and facility is working with [facility] currently  for potential placement. Will call RN CM back with decision.   Call to [facility] @ [phone]-states admissions staff is out of office today  and can be reached for urgent needs @ [phone].  Call to [facility] [place] [phone]: spoke with Mervin Kung confirmed facility does not have memory care unit and are not able to handle dementia patient at this time.   Expected Discharge Plan: Memory Care Barriers to Discharge: ED Patient Insisting on an Alternate Living Situation/Facility  Expected Discharge Plan and Services Expected Discharge Plan: Memory Care       Living arrangements for the past 2 months: Assisted Living Facility                                       Social Determinants of Health (SDOH) Interventions    Readmission Risk Interventions No flowsheet data found.

## 2020-10-27 ENCOUNTER — Encounter: Payer: Self-pay | Admitting: Internal Medicine

## 2020-10-27 DIAGNOSIS — E43 Unspecified severe protein-calorie malnutrition: Secondary | ICD-10-CM

## 2020-10-27 LAB — FOLATE RBC
Folate, Hemolysate: 472 ng/mL
Folate, RBC: 1337 ng/mL (ref >498)
Hematocrit: 35.3 % — ABNORMAL LOW (ref 37.5–51.0)

## 2020-10-27 MED ORDER — NEPRO/CARBSTEADY PO LIQD
237.0000 mL | Freq: Three times a day (TID) | ORAL | Status: DC
  Administered 2020-10-27 – 2020-11-04 (×23): 237 mL via ORAL

## 2020-10-27 MED ORDER — AMOXICILLIN-POT CLAVULANATE 875-125 MG PO TABS
1.0000 | ORAL_TABLET | Freq: Two times a day (BID) | ORAL | Status: AC
  Administered 2020-10-27 (×2): 1 via ORAL
  Filled 2020-10-27 (×2): qty 1

## 2020-10-27 NOTE — Progress Notes (Signed)
Initial Nutrition Assessment  DOCUMENTATION CODES:   Severe malnutrition in context of social or environmental circumstances  INTERVENTION:   Nepro Shake po TID, each supplement provides 425 kcal and 19 grams protein  Magic cup TID with meals, each supplement provides 290 kcal and 9 grams of protein  Pt at high refeed risk; recommend monitor potassium, magnesium and phosphorus labs daily until stable  NUTRITION DIAGNOSIS:   Severe Malnutrition related to social / environmental circumstances (dementia, advanced age) as evidenced by severe fat depletion, severe muscle depletion.  GOAL:   Patient will meet greater than or equal to 90% of their needs  MONITOR:   PO intake, Supplement acceptance, Labs, Weight trends, Skin, I & O's  REASON FOR ASSESSMENT:   Malnutrition Screening Tool    ASSESSMENT:   87 y.o. male  with past medical history of HTN, HLD, dementia, BPH, anxiety, recently discharged from hospital to ALF after admission for a fall with rhabdomyolysis and dementia with behavior disturbance admitted on 10/20/2020 with aspiration pneumonia and fall.  Pt unable to provide nutrition related history. Pt is known to nutrition department from a recent previous admit. Pt with poor appetite and oral intake at baseline per family report. Pt requires assistance with meals. Pt drinking Nepro during his last admission. Pt seen by SLP 8/8 and placed on a dysphagia 1/nectar thick diet. RD will add supplements to help pt meet his estimated needs. Pt documented to have eaten 80% of breakfast and 95% of lunch. Pt is likely at refeed risk. There is no recent weight history in chart to determine if any recent significant weight changes. Palliative care following; family considering hospice at discharge.   Medications reviewed and include: D3, lovenox, senokot, B12  Labs reviewed: K 3.7 wnl B12 1651(H)- 8/7  NUTRITION - FOCUSED PHYSICAL EXAM:  Flowsheet Row Most Recent Value  Orbital  Region Moderate depletion  Upper Arm Region Severe depletion  Thoracic and Lumbar Region Severe depletion  Buccal Region Moderate depletion  Temple Region Moderate depletion  Clavicle Bone Region Severe depletion  Clavicle and Acromion Bone Region Moderate depletion  Scapular Bone Region Moderate depletion  Dorsal Hand Severe depletion  Patellar Region Severe depletion  Anterior Thigh Region Severe depletion  Posterior Calf Region Severe depletion  Edema (RD Assessment) None  Hair Reviewed  Eyes Reviewed  Mouth Reviewed  Skin Reviewed  Nails Reviewed   Diet Order:    Diet Order             DIET - DYS 1 Room service appropriate? Yes; Fluid consistency: Nectar Thick  Diet effective now                  EDUCATION NEEDS:   No education needs have been identified at this time  Skin:  Skin Assessment: Reviewed RN Assessment (wounds arm, R foot and L & R knees)  Last BM:  8/9- type 6  Height:   Ht Readings from Last 1 Encounters:  10/25/20 5\' 5"  (1.651 m)    Weight:   Wt Readings from Last 1 Encounters:  10/25/20 57.4 kg    Ideal Body Weight:  61.8 kg  BMI:  Body mass index is 21.05 kg/m.  Estimated Nutritional Needs:   Kcal:  1600-1800kcal/day  Protein:  80-90g/day  Fluid:  1.5-1.7L/day  12/25/20 MS, RD, LDN Please refer to [facility] for RD and/or RD on-call/weekend/after hours pager

## 2020-10-27 NOTE — TOC Progression Note (Signed)
Transition of Care [facility]) - Progression Note    Patient Details  Name: Scott Montgomery MRN: [id number] Date of Birth: 1929-11-22  Transition [facility]) CM/SW Contact  Chapman Fitch, RN Phone Number: 10/27/2020, 2:36 PM  Clinical Narrative:    Clinical faxed to [facility] @ [phone] s/w [name]    Expected Discharge Plan: Memory Care Barriers to Discharge: ED Patient Insisting on an Alternate Living Situation/Facility  Expected Discharge Plan and Services Expected Discharge Plan: Memory Care       Living arrangements for the past 2 months: Assisted Living Facility                                       Social Determinants of Health (SDOH) Interventions    Readmission Risk Interventions No flowsheet data found.

## 2020-10-27 NOTE — TOC Progression Note (Addendum)
Transition of Care [facility]) - Progression Note    Patient Details  Name: Marten Iles MRN: [id number] Date of Birth: [birth year]  Transition of Care [facility]) CM/SW Contact  Cetronia Cellar, RN Phone Number: 10/27/2020, 1:49 PM  Clinical Narrative:    Call to [facility] @ [phone]: s/w [name] who reports she is hopeful for acceptance but requested updated nurse notes to show behaviors and palliative care notes. Once DON able to review and approve patient Alona Bene will [facility] Cerise [facility] and move forward with transfer to memory care.    Expected Discharge Plan: Memory Care Barriers to Discharge: ED Patient Insisting on an Alternate Living Situation/Facility  Expected Discharge Plan and Services Expected Discharge Plan: Memory Care       Living arrangements for the past 2 months: Assisted Living Facility                                       Social Determinants of Health (SDOH) Interventions    Readmission Risk Interventions No flowsheet data found.

## 2020-10-27 NOTE — Progress Notes (Signed)
Mobility Specialist - Progress Note   10/27/20 1400  Mobility  Activity Refused mobility  Mobility performed by Mobility specialist    Pt declined mobility at this time, no reason specified. Pt reports wanting to rest, agreeable for mobility to come back at a later time. Will attempt session as time permits.    Filiberto Pinks Mobility Specialist 10/27/20, 2:05 PM

## 2020-10-27 NOTE — Progress Notes (Addendum)
Palliative: [name], [place], is sitting up in bed.  He will briefly make but not keep eye contact.  He has known dementia, is alert, oriented to self only at this time.  I believe that he is able to make his basic needs known. Nursing staff is at bedside attending to needs.  Scott Montgomery mentions several times about going to sleep, and mentions the time on the clock.  Both nursing staff, share that it is daytime.  After lifting the blinds and several reminders, Scott Montgomery does endorse that it is daytime.  He tells staff that he needs to get up to the toilet.  Call to wife of 15 years, Scott Montgomery.  Scott Montgomery shares that her daughter called to speak to bedside nursing staff this morning.  Scott Montgomery shares that she understands Scott Montgomery is a little better today.  We talked about his mental status, his desire to get out of bed.  We also talked about Pete's chronic illness burden which is progressive.  Scott Montgomery shares that she certainly understands that he has progressive incurable disease burden.  We talked about disposition and continued concerns about poor by mouth food and liquid intake.  Initially, she questions whether Scott Montgomery would benefit from hospice care.  We talk in detail about "treat the treatable" hospice care, and Scott Montgomery agrees to outpatient hospice care following.  Conference with attending, bedside nursing staff, transition of care team related to patient condition, needs, goals of care, disposition.  Plan: At this point continue to treat the treatable but no CPR or intubation.  "Treat the treatable" hospice care at facility to which he discharges.  35 minutes  Lillia Carmel, NP Palliative medicine team Team phone [phone] Greater than 50% of this time was spent counseling and coordinating care related to the above assessment and plan.

## 2020-10-27 NOTE — Progress Notes (Signed)
[facility]  - [place] at [facility]   PATIENT NAME: Scott Montgomery    MR#:  [id number]  DATE OF BIRTH:  01-Apr-1929  SUBJECTIVE:  Patient took his meds today per Rn and answered some questions REVIEW OF SYSTEMS:   Review of Systems  Unable to perform ROS: Medical condition  Tolerating Diet: no Tolerating PT: patient refusing, not participating  DRUG ALLERGIES:  No Known Allergies  VITALS:  Blood pressure (!) 151/69, pulse 60, temperature 98.3 F (36.8 C), temperature source Oral, resp. rate 17, height 5\' 5"  (1.651 m), weight 57.4 kg, SpO2 95 %.  PHYSICAL EXAMINATION:   Physical Exam limited exam  GENERAL:  87 y.o.-year-old patient lying in the bed with no acute distress. Appears chronically ill, deconditioned LUNGS: Normal breath sounds bilaterally, no wheezing, rales, rhonchi. No use of accessory muscles of respiration.  CARDIOVASCULAR: S1, S2 normal. No murmurs, rubs, or gallops.  ABDOMEN: Soft, nontender, nondistended.  EXTREMITIES: No cyanosis, clubbing or edema b/l.    NEUROLOGIC: opens eyes to verbal command. Does not converse much. Somewhat lethargic PSYCHIATRIC:  patient lethargic , answers some quesitons SKIN: No obvious rash, lesion, or ulcer.  Pressure Injury 10/18/20 Arm Lower;Posterior;Right Deep Tissue Pressure Injury - Purple or maroon localized area of discolored intact skin or blood-filled blister due to damage of underlying soft tissue from pressure and/or shear. (Active)  10/18/20 2000  Location: Arm  Location Orientation: Lower;Posterior;Right (forearm)  Staging: Deep Tissue Pressure Injury - Purple or maroon localized area of discolored intact skin or blood-filled blister due to damage of underlying soft tissue from pressure and/or shear. (per Wound care note 10/18/20)  Wound Description (Comments):   Present on Admission: Yes    LABORATORY PANEL:  CBC Recent Labs  Lab 10/26/20 0442  WBC 8.4  HGB 11.8*  HCT 35.8*  PLT 152      Chemistries  Recent Labs  Lab 10/24/20 1344 10/25/20 0518 10/26/20 0442  NA 139   < > 140  K 4.2   < > 3.7  CL 102   < > 105  CO2 29   < > 28  GLUCOSE 135*   < > 71  BUN 19   < > 17  CREATININE 0.84   < > 0.76  CALCIUM 9.1   < > 8.7*  AST 45*  --   --   ALT 49*  --   --   ALKPHOS 43  --   --   BILITOT 1.2  --   --    < > = values in this interval not displayed.    Cardiac Enzymes No results for input(s): TROPONINI in the last 168 hours. RADIOLOGY:  No results found. ASSESSMENT AND PLAN:   87 y.o. male  with past medical history of HTN/HLD, dementia, BPH, anxiety, recently discharged from hospital to ALF after admission for a fall with rhabdomyolysis and dementia with behavior disturbance admitted on 10/20/2020 with aspiration pneumonia, fall, protein calorie malnutrition likely secondary to underlying dementia and poor by mouth intake.  Aspiration pneumonia with acute hypoxic respiratory failure -- patient currently on room air. No respiratory distress. -- will start pured diet -- IV unasyn --change to po augmentin -- white count normal.  acute metabolic encephalopathy secondary to underlying dementia with aspiration pneumonia. -- patient unable to hold meaningful conversation remains lethargic and intermittently answers questions --remains calm  failure to thrive protein calorie malnutrition poor PO intake Falls -- overall has a poor prognosis. Patient seen by palliative  care. -- they have discussed with patient's wife Petra Sargeant -- patient is DNR -- wife understands poor prognosis. -- she is considering hospice at discharge.  falls secondary to generalized ability and deconditioning -- patient is not participating with PT. Suspect this is due to dementia -- I have resumed most of patient's home meds. He has been on and off refusing oral meds  patient was seen by palliative care. Discussion was made by palliative care nurse practitioner with wife and  their requesting hospice services at discharge.  Procedures: Family communication :spoke with Kathie Rhodes Sproles Consults : palliative care CODE STATUS: DNR DNI DVT Prophylaxis : enoxaparin Level of care: Med-Surg Status is: Inpatient  Remains inpatient appropriate because:Inpatient level of care appropriate due to severity of illness  Dispo: The patient is from: ALF              Anticipated d/c is to:  TBD              Patient currently medically optimized for discharge   difficult to place patient No awaiting memory care unit bed availability       TOTAL TIME TAKING CARE OF THIS PATIENT: 25 minutes.  >50% time spent on counselling and coordination of care  Note: This dictation was prepared with Dragon dictation along with smaller phrase technology. Any transcriptional errors that result from this process are unintentional.  Enedina Finner M.D    [facility]   CC: Primary care physician; [facility] Patient ID: Scott Montgomery, male   DOB: [birth year], 87 y.o.   MRN: [id number]

## 2020-10-27 NOTE — Progress Notes (Signed)
Occupational Therapy Treatment Patient Details Name: Scott Montgomery MRN: [id number] DOB: December 07, 1929 Today's Date: 10/27/2020    History of present illness Scott Montgomery "Scott Lame" Montgomery is a 87 y.o. male with dementia who comes in from ALF after a fall.  Patient was discharged from [facility] to [facility] on 10/20/20 after workup for UTI.  Upon arrival to [facility] within a 1 hour period patient had  unwitnessed fall. Staff brought him back to the ED for evaluation. Imaging negative for acute abnormality.   OT comments  Chart reviewed, pt greeted in bed with NT present, agreeable to tx session. Pt is A&Ox1 however pleasant and able to be re-directed to task at hand. Pt performs sit<>supine with supervision, seated at edge of bed for grooming ADL tasks with supervision. Pt requires MAX A for washing hands, MIN A to dry. Pt performs STS with MIN A, 3 lateral steps up the bed with MIN A. Pt is left as received, NAD, safety maintained. OT will continue to follow while admitted.      Follow Up Recommendations  SNF;Supervision/Assistance - 24 hour    Equipment Recommendations   (per next venue of care)    Recommendations for Other Services      Precautions / Restrictions Precautions Precautions: Fall       Mobility Bed Mobility Overal bed mobility: Needs Assistance Bed Mobility: Supine to Sit;Sit to Supine Rolling: Supervision   Supine to sit: Min guard Sit to supine: Min guard        Transfers Overall transfer level: Needs assistance Equipment used: 1 person hand held assist Transfers: Sit to/from Stand Sit to Stand: Min assist              Balance Overall balance assessment: Needs assistance Sitting-balance support: Bilateral upper extremity supported;Feet supported Sitting balance-Leahy Scale: Fair   Postural control: Posterior lean Standing balance support: No upper extremity supported Standing balance-Leahy Scale: Poor                              ADL either performed or assessed with clinical judgement   ADL Overall ADL's : Needs assistance/impaired     Grooming: Wash/dry hands;Minimal assistance;Maximal assistance Grooming Details (indicate cue type and reason): MAX A for washing, MIN A for drying at EOB                                     Vision Baseline Vision/History: Wears glasses                Cognition Arousal/Alertness: Awake/alert Behavior During Therapy: Impulsive Overall Cognitive Status: History of cognitive impairments - at baseline                                 General Comments: Pt is oriented to self on this date                          Pertinent Vitals/ Pain       Pain Assessment: No/denies pain   Frequency  Min 1X/week        Progress Toward Goals  OT Goals(current goals can now be found in the care plan section)  Progress towards OT goals: Progressing toward goals     Plan Discharge plan remains appropriate;Frequency remains  appropriate    Co-evaluation                 AM-PAC OT "6 Clicks" Daily Activity     Outcome Measure   Help from another person eating meals?: A Little Help from another person taking care of personal grooming?: A Lot Help from another person toileting, which includes using toliet, bedpan, or urinal?: A Lot Help from another person bathing (including washing, rinsing, drying)?: A Lot Help from another person to put on and taking off regular upper body clothing?: A Lot Help from another person to put on and taking off regular lower body clothing?: A Lot 6 Click Score: 13    End of Session Equipment Utilized During Treatment: Gait belt  OT Visit Diagnosis: Other abnormalities of gait and mobility (R26.89);Muscle weakness (generalized) (M62.81);Repeated falls (R29.6)   Activity Tolerance Patient tolerated treatment well   Patient Left in bed;with call bell/phone within reach;with nursing/sitter in room    Nurse Communication          Time: 1410-1433 OT Time Calculation (min): 23 min  Charges: OT General Charges $OT Visit: 1 Visit OT Treatments $Self Care/Home Management : 8-22 mins $Therapeutic Activity: 8-22 mins   Oleta Mouse, OTD OTR/L  10/27/20, 3:21 PM

## 2020-10-28 NOTE — Progress Notes (Signed)
[facility]  - [place] at [facility]   PATIENT NAME: Scott Montgomery    MR#:  [id number]  DATE OF BIRTH:  September 04, 1929  SUBJECTIVE:  Patient took his meds today per Rn and answered some questions REVIEW OF SYSTEMS:   Review of Systems  Unable to perform ROS: Medical condition  Tolerating Diet: no Tolerating PT: patient refusing, not participating  DRUG ALLERGIES:  No Known Allergies  VITALS:  Blood pressure (!) 161/55, pulse 67, temperature 98.1 F (36.7 C), temperature source Oral, resp. rate 18, height 5\' 5"  (1.651 m), weight 57.4 kg, SpO2 94 %.  PHYSICAL EXAMINATION:   Physical Exam limited exam  GENERAL:  87 y.o.-year-old patient lying in the bed with no acute distress. Appears chronically ill, deconditioned LUNGS: Normal breath sounds bilaterally, no wheezing, rales, rhonchi. No use of accessory muscles of respiration.  CARDIOVASCULAR: S1, S2 normal. No murmurs, rubs, or gallops.  ABDOMEN: Soft, nontender, nondistended.  EXTREMITIES: No cyanosis, clubbing or edema b/l.    NEUROLOGIC: opens eyes to verbal command. Does not converse much. Somewhat lethargic PSYCHIATRIC:  patient lethargic , answers some quesitons SKIN: No obvious rash, lesion, or ulcer.  Pressure Injury 10/18/20 Arm Lower;Posterior;Right Deep Tissue Pressure Injury - Purple or maroon localized area of discolored intact skin or blood-filled blister due to damage of underlying soft tissue from pressure and/or shear. (Active)  10/18/20 2000  Location: Arm  Location Orientation: Lower;Posterior;Right (forearm)  Staging: Deep Tissue Pressure Injury - Purple or maroon localized area of discolored intact skin or blood-filled blister due to damage of underlying soft tissue from pressure and/or shear. (per Wound care note 10/18/20)  Wound Description (Comments):   Present on Admission: Yes    LABORATORY PANEL:  CBC Recent Labs  Lab 10/26/20 0442  WBC 8.4  HGB 11.8*  HCT 35.8*  PLT 152      Chemistries  Recent Labs  Lab 10/24/20 1344 10/25/20 0518 10/26/20 0442  NA 139   < > 140  K 4.2   < > 3.7  CL 102   < > 105  CO2 29   < > 28  GLUCOSE 135*   < > 71  BUN 19   < > 17  CREATININE 0.84   < > 0.76  CALCIUM 9.1   < > 8.7*  AST 45*  --   --   ALT 49*  --   --   ALKPHOS 43  --   --   BILITOT 1.2  --   --    < > = values in this interval not displayed.    Cardiac Enzymes No results for input(s): TROPONINI in the last 168 hours. RADIOLOGY:  No results found. ASSESSMENT AND PLAN:   87 y.o. male  with past medical history of HTN/HLD, dementia, BPH, anxiety, recently discharged from hospital to ALF after admission for a fall with rhabdomyolysis and dementia with behavior disturbance admitted on 10/20/2020 with aspiration pneumonia, fall, protein calorie malnutrition likely secondary to underlying dementia and poor by mouth intake.  Aspiration pneumonia with acute hypoxic respiratory failure -- patient currently on room air. No respiratory distress. -- will start pured diet -- IV unasyn --change to po augmentin -- white count normal.  acute metabolic encephalopathy secondary to underlying dementia with aspiration pneumonia. -- patient unable to hold meaningful conversation remains lethargic and intermittently answers questions --remains calm  failure to thrive protein calorie malnutrition poor PO intake Falls -- overall has a poor prognosis. Patient seen by palliative  care. -- they have discussed with patient's wife Desi Rowe -- patient is DNR -- wife understands poor prognosis. -- she is considering hospice at discharge.  falls secondary to generalized ability and deconditioning -- patient is not participating with PT. Suspect this is due to dementia -- I have resumed most of patient's home meds. He has been on and off refusing oral meds  patient was seen by palliative care. Discussion was made by palliative care nurse practitioner with wife and  their requesting hospice services at discharge.  Procedures: Family communication :spoke with Kathie Rhodes Pawley 8/9 Consults : palliative care CODE STATUS: DNR DNI DVT Prophylaxis : enoxaparin Level of care: Med-Surg Status is: Inpatient  Remains inpatient appropriate because:Inpatient level of care appropriate due to severity of illness  Dispo: The patient is from: ALF              Anticipated d/c is to:  TBD              Patient currently medically optimized for discharge   difficult to place patient No awaiting memory care unit bed availability       TOTAL TIME TAKING CARE OF THIS PATIENT: 25 minutes.  >50% time spent on counselling and coordination of care  Note: This dictation was prepared with Dragon dictation along with smaller phrase technology. Any transcriptional errors that result from this process are unintentional.  Enedina Finner M.D    [facility]   CC: Primary care physician; [facility] Patient ID: Darcell Sabino, male   DOB: 1929/06/05, 87 y.o.   MRN: [id number]

## 2020-10-29 LAB — CULTURE, BLOOD (ROUTINE X 2)
Culture: NO GROWTH
Culture: NO GROWTH
Special Requests: ADEQUATE

## 2020-10-29 LAB — VITAMIN B1: Vitamin B1 (Thiamine): 163.9 nmol/L (ref 66.5–200.0)

## 2020-10-29 NOTE — Progress Notes (Signed)
Mobility Specialist - Progress Note   10/29/20 1700  Mobility  Activity Refused mobility  Mobility performed by Mobility specialist    Pt declined mobility, no reason specified. Pt sleeping on arrival and voices wanting to rest despite encouragement. Will attempt session another date/time.    Filiberto Pinks Mobility Specialist 10/29/20, 5:05 PM

## 2020-10-29 NOTE — Progress Notes (Signed)
[facility]  - [place] at [facility]   PATIENT NAME: Scott Montgomery    MR#:  [id number]  DATE OF BIRTH:  1929-11-05  SUBJECTIVE:  More awake and sitting up fed some food earleir REVIEW OF SYSTEMS:   Review of Systems  Unable to perform ROS: Medical condition  Tolerating Diet: no Tolerating PT: patient refusing, not participating  DRUG ALLERGIES:  No Known Allergies  VITALS:  Blood pressure (!) 166/89, pulse 66, temperature 97.8 F (36.6 C), temperature source Oral, resp. rate 18, height 5\' 5"  (1.651 m), weight 57.4 kg, SpO2 96 %.  PHYSICAL EXAMINATION:   Physical Exam limited exam  GENERAL:  87 y.o.-year-old patient lying in the bed with no acute distress. Appears chronically ill, deconditioned LUNGS: Normal breath sounds bilaterally, no wheezing, rales, rhonchi. CARDIOVASCULAR: S1, S2 normal. No murmurs, rubs, or gallops.  ABDOMEN: Soft, nontender, nondistended.  EXTREMITIES: No cyanosis, clubbing or edema b/l.    NEUROLOGIC: opens eyes to verbal command.alert c PSYCHIATRIC:  patient lethargic , answers some quesitons   Pressure Injury 10/18/20 Arm Lower;Posterior;Right Deep Tissue Pressure Injury - Purple or maroon localized area of discolored intact skin or blood-filled blister due to damage of underlying soft tissue from pressure and/or shear. (Active)  10/18/20 2000  Location: Arm  Location Orientation: Lower;Posterior;Right (forearm)  Staging: Deep Tissue Pressure Injury - Purple or maroon localized area of discolored intact skin or blood-filled blister due to damage of underlying soft tissue from pressure and/or shear. (per Wound care note 10/18/20)  Wound Description (Comments):   Present on Admission: Yes    LABORATORY PANEL:  CBC Recent Labs  Lab 10/26/20 0442  WBC 8.4  HGB 11.8*  HCT 35.8*  PLT 152     Chemistries  Recent Labs  Lab 10/24/20 1344 10/25/20 0518 10/26/20 0442  NA 139   < > 140  K 4.2   < > 3.7  CL 102   < > 105   CO2 29   < > 28  GLUCOSE 135*   < > 71  BUN 19   < > 17  CREATININE 0.84   < > 0.76  CALCIUM 9.1   < > 8.7*  AST 45*  --   --   ALT 49*  --   --   ALKPHOS 43  --   --   BILITOT 1.2  --   --    < > = values in this interval not displayed.    Cardiac Enzymes No results for input(s): TROPONINI in the last 168 hours. RADIOLOGY:  No results found. ASSESSMENT AND PLAN:   87 y.o. male  with past medical history of HTN/HLD, dementia, BPH, anxiety, recently discharged from hospital to ALF after admission for a fall with rhabdomyolysis and dementia with behavior disturbance admitted on 10/20/2020 with aspiration pneumonia, fall, protein calorie malnutrition likely secondary to underlying dementia and poor by mouth intake.  Aspiration pneumonia with acute hypoxic respiratory failure -- patient currently on room air. No respiratory distress. -- will start pured diet -- IV unasyn --change to po augmentin--completed -- white count normal.  acute metabolic encephalopathy secondary to underlying dementia with aspiration pneumonia. -- patient unable to hold meaningful conversation remains lethargic and intermittently answers questions --remains calm  failure to thrive protein calorie malnutrition poor PO intake Falls -- overall has a poor prognosis. Patient seen by palliative care. -- they have discussed with patient's wife Akif Weldy -- patient is DNR -- wife understands poor prognosis. -- she  is considering hospice at discharge.  falls secondary to generalized ability and deconditioning -- patient is not participating with PT. Suspect this is due to dementia -- I have resumed most of patient's home meds. He has been on and off refusing oral meds  patient was seen by palliative care. Discussion was made by palliative care nurse practitioner with wife and their requesting hospice services at discharge.  Procedures: Family communication :spoke with Kathie Rhodes Gilberto  Consults :  palliative care CODE STATUS: DNR DNI DVT Prophylaxis : enoxaparin Level of care: Med-Surg Status is: Inpatient  Remains inpatient appropriate because:Inpatient level of care appropriate due to severity of illness  Dispo: The patient is from: ALF              Anticipated d/c is to:  TBD              Patient currently medically optimized for discharge   difficult to place patient yes!!   awaiting memory care unit bed availability       TOTAL TIME TAKING CARE OF THIS PATIENT: 20 minutes.  >50% time spent on counselling and coordination of care  Note: This dictation was prepared with Dragon dictation along with smaller phrase technology. Any transcriptional errors that result from this process are unintentional.  Enedina Finner M.D    [facility]   CC: Primary care physician; [facility] Patient ID: Wendy Mikles, male   DOB: May 21, 1929, 87 y.o.   MRN: [id number]

## 2020-10-29 NOTE — TOC Progression Note (Signed)
Transition of Care [facility]) - Progression Note    Patient Details  Name: Scott Montgomery MRN: [id number] Date of Birth: [birth year]  Transition of Care [facility]) CM/SW Contact  Chapman Fitch, RN Phone Number: 10/29/2020, 10:05 AM  Clinical Narrative:     Call to [facility] @ [phone]: VM left for  [name] to follow up on referral  Expected Discharge Plan: Memory Care Barriers to Discharge: ED Patient Insisting on an Alternate Living Situation/Facility  Expected Discharge Plan and Services Expected Discharge Plan: Memory Care       Living arrangements for the past 2 months: Assisted Living Facility                                       Social Determinants of Health (SDOH) Interventions    Readmission Risk Interventions No flowsheet data found.

## 2020-10-29 NOTE — TOC Progression Note (Signed)
Transition of Care [facility]) - Progression Note    Patient Details  Name: Scott Montgomery MRN: [id number] Date of Birth: 1929/05/30  Transition of Care [facility]) CM/SW Contact  Chapman Fitch, RN Phone Number: 10/29/2020, 3:06 PM  Clinical Narrative:    Call to [facility] @ [phone]: spoke with [name].  She states that the clinical has not been reviewed yet with DON, and she will contact me directly when a decision has been made     Expected Discharge Plan: Memory Care Barriers to Discharge: ED Patient Insisting on an Alternate Living Situation/Facility  Expected Discharge Plan and Services Expected Discharge Plan: Memory Care       Living arrangements for the past 2 months: Assisted Living Facility                                       Social Determinants of Health (SDOH) Interventions    Readmission Risk Interventions No flowsheet data found.

## 2020-10-29 NOTE — Progress Notes (Signed)
TOC please call patients daughter to discuss discharge planning. Beecher Mcardle [phone]. She is concerned why the facility that's being looked into is in [place]. Thanks!

## 2020-10-29 NOTE — Progress Notes (Signed)
  Speech Language Pathology Treatment: Dysphagia  Patient Details Name: Scott Montgomery MRN: [id number] DOB: 1929/06/26 Today's Date: 10/29/2020 Time: 3536-1443 SLP Time Calculation (min) (ACUTE ONLY): 20 min  Assessment / Plan / Recommendation Clinical Impression  Pt seen for ongoing dysphagia treatment. Overall, pt appears much improved over previous session. He was awake, oriented to himself, able to make his desire known as well as sustained attention to task.   Pt sitting upright consuming dysphagia 1 lunch tray. Pt was feeding himself pureed fruit and magic cup. Pt with mildly prolonged oral phase but had complete oral clearing and was free of any overt s/s of aspiration.   Skilled observation was provided of pt consuming thin liquids via straw. [name] swallow appeared swift and was free of overt s/s of aspiration at bedside. While silent aspiration cannot be assessed at bedside, pt's risk appears greatly reduced over time of the previous evaluation. Therefore, recommend continuing dysphagia 1 diet but upgrading liquids to thin liquids via straw.     HPI HPI: Pt  is a 87 y.o. male with medical history significant for Dementia -- lives in a facility, hyperlipidemia, BPH, hypertension, cataracts, history of colon polyps, generalized anxiety disorder, osteoarthritis, pseudophakia, presents to the emergency department from facility for chief concerns of altered mentation.  Initially at admit, he was alert to name, age. He was agitated attempting to take out his lines and IV requiring bilateral mittens for the confusion.  SW is attempting to place pt at a more skilled level facility which is taking a longer time - pt remains in the ED, not admitted.  During this time, NSG has reported some difficulty w/ swallowing last evening.  Pt has been seen by OT/PT Rehab.  CXR on 10/20/2020: No acute chest findings. 2. Unchanged left posterior sixth rib fracture.      SLP Plan  Continue with current  plan of care       Recommendations  Diet recommendations: Dysphagia 1 (puree);Thin liquid Liquids provided via: Straw Medication Administration: Crushed with puree Supervision: Full supervision/cueing for compensatory strategies;Staff to assist with self feeding Compensations: Minimize environmental distractions;Slow rate;Small sips/bites Postural Changes and/or Swallow Maneuvers: Seated upright 90 degrees;Upright 30-60 min after meal                Oral Care Recommendations: Oral care BID Follow up Recommendations: Skilled Nursing facility SLP Visit Diagnosis: Dysphagia, oropharyngeal phase (R13.12) Plan: Continue with current plan of care       GO               Anvika Gashi B. Dreama Saa M.S., CCC-SLP, [facility] Speech-Language Pathologist Rehabilitation Services Office [phone]  Scott Montgomery 10/29/2020, 12:54 PM

## 2020-10-30 DIAGNOSIS — R627 Adult failure to thrive: Secondary | ICD-10-CM

## 2020-10-30 MED ORDER — AMLODIPINE BESYLATE 5 MG PO TABS
5.0000 mg | ORAL_TABLET | Freq: Every day | ORAL | Status: DC
  Administered 2020-10-30 – 2020-11-04 (×6): 5 mg via ORAL
  Filled 2020-10-30 (×6): qty 1

## 2020-10-30 MED ORDER — RISPERIDONE 1 MG PO TABS
1.0000 mg | ORAL_TABLET | Freq: Every day | ORAL | Status: DC
  Administered 2020-10-30 – 2020-11-03 (×5): 1 mg via ORAL
  Filled 2020-10-30 (×6): qty 1

## 2020-10-30 MED ORDER — LORAZEPAM 0.5 MG PO TABS
0.5000 mg | ORAL_TABLET | Freq: Two times a day (BID) | ORAL | Status: DC | PRN
  Administered 2020-10-30 – 2020-10-31 (×3): 0.5 mg via ORAL
  Filled 2020-10-30 (×3): qty 1

## 2020-10-30 MED ORDER — RISPERIDONE 0.5 MG PO TABS
0.5000 mg | ORAL_TABLET | Freq: Two times a day (BID) | ORAL | Status: DC | PRN
  Administered 2020-10-30 – 2020-11-02 (×2): 0.5 mg via ORAL
  Filled 2020-10-30 (×4): qty 1

## 2020-10-30 MED ORDER — HALOPERIDOL LACTATE 5 MG/ML IJ SOLN
1.0000 mg | Freq: Four times a day (QID) | INTRAMUSCULAR | Status: DC | PRN
Start: 2020-10-30 — End: 2020-11-05
  Administered 2020-10-30 (×2): 1 mg via INTRAMUSCULAR
  Filled 2020-10-30 (×2): qty 1

## 2020-10-30 NOTE — Progress Notes (Signed)
SLP Cancellation Note  Patient Details Name: Scott Montgomery MRN: [id number] DOB: 1929/10/08   Cancelled treatment:       Reason Eval/Treat Not Completed: Patient declined, no reason specified  Pt agitated this afternoon, difficult to redirect. Will follow up as able.   Rosselyn Martha B. Dreama Saa M.S., CCC-SLP, [facility] Speech-Language Pathologist Rehabilitation Services Office [phone]   Reuel Derby 10/30/2020, 1:49 PM

## 2020-10-30 NOTE — TOC Progression Note (Signed)
Transition of Care [facility]) - Progression Note    Patient Details  Name: Scott Montgomery MRN: [id number] Date of Birth: 06-09-1929  Transition of Care [facility]) CM/SW Contact  Chapman Fitch, RN Phone Number: 10/30/2020, 2:41 PM  Clinical Narrative:     Call to [facility] @ [phone]: VM left with [name]  Expected Discharge Plan: Memory Care Barriers to Discharge: ED Patient Insisting on an Alternate Living Situation/Facility  Expected Discharge Plan and Services Expected Discharge Plan: Memory Care       Living arrangements for the past 2 months: Assisted Living Facility                                       Social Determinants of Health (SDOH) Interventions    Readmission Risk Interventions No flowsheet data found.

## 2020-10-30 NOTE — Progress Notes (Addendum)
Mobility Specialist - Progress Note   10/30/20 1200  Mobility  Activity Ambulated in room  Level of Assistance Contact guard assist, steadying assist  Assistive Device None  Distance Ambulated (ft) 60 ft  Mobility Ambulated with assistance in room  Mobility Response Tolerated well  Mobility performed by Mobility specialist  $Mobility charge 1 Mobility    Pt sitting EOB on arrival, sitter at bedside. Pt agitated and attempting to get up to find personal belongings (shoes and clothes) on arrival. Difficult to redirect, not following commands. Pt attempting to take off gait belt several times during session "I'm not wearing this until they find my shoes". Mobility and NT attempted to locate items in room, unable to locate. Pt transferred from bed-chair-BSC numerous times during session. Mildly unsteady with ambulation, requiring CGA. RN notified of pt's agitation and refusal to follow commands, RN entered to assist and address issue. Attempted to get pt to return to bed prior to exit, pt refused, wanting to be left on [facility] to wait for lunch. Sitter still present at exit.    Filiberto Pinks Mobility Specialist 10/30/20, 12:13 PM

## 2020-10-30 NOTE — Progress Notes (Signed)
Patient ID: Scott Montgomery, male   DOB: 1929/12/03, 87 y.o.   MRN: [id number] [facility] PROGRESS NOTE  Scott Montgomery [id number]:[id number] DOB: [birth year] DOA: 10/20/2020 PCP: [facility]  HPI/Subjective: Patient was able to sit up and talk with me.  Answer some questions.  Unable to elaborate much.  Offers no complaints.  States he feels well.  Initially admitted with aspiration pneumonia.  Objective: Vitals:   10/30/20 0859 10/30/20 1138  BP: (!) 197/87 131/86  Pulse: 79 92  Resp: 16   Temp: 98.4 F (36.9 C)   SpO2: 97%     Intake/Output Summary (Last 24 hours) at 10/30/2020 1520 Last data filed at 10/30/2020 0329 Gross per 24 hour  Intake 236 ml  Output --  Net 236 ml   Filed Weights   10/20/20 1919 10/25/20 0300  Weight: 50 kg 57.4 kg    ROS: Review of Systems  Respiratory:  Negative for shortness of breath.   Cardiovascular:  Negative for chest pain.  Gastrointestinal:  Negative for abdominal pain.  Exam: Physical Exam HENT:     Head: Normocephalic.     Mouth/Throat:     Pharynx: No oropharyngeal exudate.  Eyes:     Conjunctiva/sclera: Conjunctivae normal.  Cardiovascular:     Rate and Rhythm: Normal rate and regular rhythm.     Heart sounds: Normal heart sounds, S1 normal and S2 normal.  Pulmonary:     Breath sounds: No decreased breath sounds, wheezing, rhonchi or rales.  Abdominal:     Palpations: Abdomen is soft.     Tenderness: There is no abdominal tenderness.  Musculoskeletal:     Right lower leg: No swelling.     Left lower leg: No swelling.  Skin:    General: Skin is warm.     Comments: Scabs seen bilateral knees.  Neurological:     Mental Status: He is alert.     Comments: Answers yes/no questions.      Scheduled Meds:  amLODipine  5 mg Oral Daily   cholecalciferol  2,000 Units Oral Daily   divalproex  125 mg Oral BID   enoxaparin (LOVENOX) injection  40 mg Subcutaneous Q24H   feeding supplement (NEPRO  CARB STEADY)  237 mL Oral TID BM   FLUoxetine  20 mg Oral Daily   methylphenidate  5 mg Oral BID   risperiDONE  1 mg Oral QHS   senna-docusate  2 tablet Oral BID   sodium chloride flush  3 mL Intravenous Q12H   tamsulosin  0.4 mg Oral Daily   vitamin B-12  1,000 mcg Oral Daily   Continuous Infusions:  sodium chloride      Assessment/Plan:  Aspiration pneumonia.  Was on IV Unasyn switched over to Augmentin and completed course. Acute metabolic encephalopathy secondary to underlying dementia and aspiration pneumonia.  Able to answer questions today.  As needed medications. Failure to thrive, severe protein calorie malnutrition.  Patient is a DO NOT RESUSCITATE Frequent falls. Essential hypertension start Norvasc low-dose BPH on Flomax  Pressure Injury 10/18/20 Arm Lower;Posterior;Right Deep Tissue Pressure Injury - Purple or maroon localized area of discolored intact skin or blood-filled blister due to damage of underlying soft tissue from pressure and/or shear. (Active)  10/18/20 2000  Location: Arm  Location Orientation: Lower;Posterior;Right (forearm)  Staging: Deep Tissue Pressure Injury - Purple or maroon localized area of discolored intact skin or blood-filled blister due to damage of underlying soft tissue from pressure and/or shear. (per Wound care note  10/18/20)  Wound Description (Comments):   Present on Admission: Yes       Code Status:     Code Status Orders  (From admission, onward)           Start     Ordered   10/24/20 1528  Do not attempt resuscitation (DNR)  Continuous       Question Answer Comment  In the event of cardiac or respiratory ARREST Do not call a "code blue"   In the event of cardiac or respiratory ARREST Do not perform Intubation, CPR, defibrillation or ACLS   In the event of cardiac or respiratory ARREST Use medication by any route, position, wound care, and other measures to relive pain and suffering. May use oxygen, suction and manual  treatment of airway obstruction as needed for comfort.   Comments Patient is a DNR      10/24/20 1528           Code Status History     Date Active Date Inactive Code Status Order ID Comments User Context   10/18/2020 1205 10/20/2020 1911 DNR [id number]  Tresa Moore, MD Inpatient   10/17/2020 1518 10/18/2020 1205 Full Code [id number]  Cox, Nadyne Coombes, DO ED      Advance Directive Documentation    Flowsheet Row Most Recent Value  Type of Advance Directive Healthcare Power of Attorney  Pre-existing out of facility DNR order (yellow form or pink MOST form) --  "MOST" Form in Place? --      Family Communication: Spoke with wife on the phone Disposition Plan: Status is: Inpatient  Dispo: The patient is from: Home              Anticipated d/c is to: Rehab              Patient currently medically stable   Difficult to place patient. Yes  Time spent: 28 minutes  Scott Montgomery [employer]

## 2020-10-30 NOTE — Progress Notes (Signed)
OT Cancellation Note  Patient Details Name: Jamichael Knotts MRN: [id number] DOB: 08-Jul-1929   Cancelled Treatment:    Reason Eval/Treat Not Completed: Other (comment). Upon attempt, mobility tech leaving pt's room, reporting pt is agitated, unable to follow commands at this time, has sitter. Will hold OT tx and re-attempt at later date/time as appropriate.    Wynona Canes, MPH, MS, OTR/L ascom [phone] 10/30/20, 1:13 PM

## 2020-10-31 LAB — CREATININE, SERUM
Creatinine, Ser: 0.72 mg/dL (ref 0.61–1.24)
GFR, Estimated: >60 mL/min (ref >60)

## 2020-10-31 NOTE — TOC Progression Note (Signed)
Transition of Care [facility]) - Progression Note    Patient Details  Name: Scott Montgomery MRN: [id number] Date of Birth: January 15, 1930  Transition of Care [facility]) CM/SW Contact  Liliana Cline, LCSW Phone Number: 10/31/2020, 12:27 PM  Clinical Narrative:   Attempted call to [name] at [facility] [place] at [phone]. He is out for the weekend. Left a VM. Provided RNCM number, requested Monday follow up.     Expected Discharge Plan: Memory Care Barriers to Discharge: ED Patient Insisting on an Alternate Living Situation/Facility  Expected Discharge Plan and Services Expected Discharge Plan: Memory Care       Living arrangements for the past 2 months: Assisted Living Facility                                       Social Determinants of Health (SDOH) Interventions    Readmission Risk Interventions No flowsheet data found.

## 2020-10-31 NOTE — Progress Notes (Signed)
Patient ID: Scott Montgomery, male   DOB: 1929-12-20, 87 y.o.   MRN: [id number] [facility] PROGRESS NOTE  Tarick Parenteau KWI:[id number] DOB: May 25, 1929 DOA: 10/20/2020 [facility]CP: [facility]  HPI/Subjective: Patient awakened from sleep.  Feels okay.  Offers no complaints.  Answer a few yes or no questions and went back to sleep.  Initially admitted with altered mental status and a fall.  Objective: Vitals:   10/30/20 2031 10/31/20 0616  BP: (!) 158/76 (!) 140/54  Pulse: 74 67  Resp: 18 18  Temp: 98.7 F (37.1 C) 97.7 F (36.5 C)  SpO2: (!) 86% 97%    Intake/Output Summary (Last 24 hours) at 10/31/2020 1354 Last data filed at 10/31/2020 2426 Gross per 24 hour  Intake 120 ml  Output --  Net 120 ml   Filed Weights   10/20/20 1919 10/25/20 0300  Weight: 50 kg 57.4 kg    ROS: Review of Systems  Respiratory:  Negative for shortness of breath.   Cardiovascular:  Negative for chest pain.  Gastrointestinal:  Negative for abdominal pain.  Exam: Physical Exam HENT:     Head: Normocephalic.     Mouth/Throat:     Pharynx: No oropharyngeal exudate.  Eyes:     General: Lids are normal.     Conjunctiva/sclera: Conjunctivae normal.  Cardiovascular:     Rate and Rhythm: Normal rate and regular rhythm.     Heart sounds: Normal heart sounds, S1 normal and S2 normal.  Pulmonary:     Breath sounds: No decreased breath sounds, wheezing, rhonchi or rales.  Abdominal:     Palpations: Abdomen is soft.     Tenderness: There is no abdominal tenderness.  Musculoskeletal:     Right lower leg: No swelling.     Left lower leg: No swelling.  Skin:    General: Skin is warm.     Findings: No rash.  Neurological:     Mental Status: He is alert.     Comments: Answers a few questions and goes back to sleep.      Scheduled Meds:  amLODipine  5 mg Oral Daily   cholecalciferol  2,000 Units Oral Daily   divalproex  125 mg Oral BID   enoxaparin (LOVENOX)  injection  40 mg Subcutaneous Q24H   feeding supplement (NEPRO CARB STEADY)  237 mL Oral TID BM   FLUoxetine  20 mg Oral Daily   methylphenidate  5 mg Oral BID   risperiDONE  1 mg Oral QHS   senna-docusate  2 tablet Oral BID   sodium chloride flush  3 mL Intravenous Q12H   tamsulosin  0.4 mg Oral Daily   vitamin B-12  1,000 mcg Oral Daily   Continuous Infusions:  sodium chloride      Assessment/Plan:  Aspiration pneumonia.  Completed antibiotic course while here dysphagia diet. Acute metabolic encephalopathy secondary to underlying dementia and aspiration pneumonia.  Patient does answer some questions and falls back asleep very easily.  As needed Risperdal for agitation Failure to thrive, severe protein calorie malnutrition.  Patient is a DO NOT RESUSCITATE Frequent falls Essential hypertension on low-dose Norvasc BPH on Flomax  Pressure Injury 10/18/20 Arm Lower;Posterior;Right Deep Tissue Pressure Injury - Purple or maroon localized area of discolored intact skin or blood-filled blister due to damage of underlying soft tissue from pressure and/or shear. (Active)  10/18/20 2000  Location: Arm  Location Orientation: Lower;Posterior;Right (forearm)  Staging: Deep Tissue Pressure Injury - Purple or maroon localized area of  discolored intact skin or blood-filled blister due to damage of underlying soft tissue from pressure and/or shear. (per Wound care note 10/18/20)  Wound Description (Comments):   Present on Admission: Yes       Code Status:     Code Status Orders  (From admission, onward)           Start     Ordered   10/24/20 1528  Do not attempt resuscitation (DNR)  Continuous       Question Answer Comment  In the event of cardiac or respiratory ARREST Do not call a "code blue"   In the event of cardiac or respiratory ARREST Do not perform Intubation, CPR, defibrillation or ACLS   In the event of cardiac or respiratory ARREST Use medication by any route, position,  wound care, and other measures to relive pain and suffering. May use oxygen, suction and manual treatment of airway obstruction as needed for comfort.   Comments Patient is a DNR      10/24/20 1528           Code Status History     Date Active Date Inactive Code Status Order ID Comments User Context   10/18/2020 1205 10/20/2020 1911 DNR [id number]  Tresa Moore, MD Inpatient   10/17/2020 1518 10/18/2020 1205 Full Code [id number]  Cox, Nadyne Coombes, DO ED      Advance Directive Documentation    Flowsheet Row Most Recent Value  Type of Advance Directive Healthcare Power of Attorney  Pre-existing out of facility DNR order (yellow form or pink MOST form) --  "MOST" Form in Place? --      Family Communication: Spoke with wife on the phone yesterday Disposition Plan: Status is: Inpatient  Dispo: The patient is from: Home              Anticipated d/c is to: Rehab or placement              Patient currently medically stable for disposition once we find a place for him   Difficult to place patient.  Yes.  Time spent: 25 minutes  Miakoda Mcmillion [employer]

## 2020-11-01 NOTE — Progress Notes (Signed)
Patient ID: Scott Montgomery, male   DOB: October 11, 1929, 87 y.o.   MRN: [id number] [facility] PROGRESS NOTE  Scott Montgomery [id number] DOB: 04/29/1929 DOA: 10/20/2020 [facility]CP: [facility]  HPI/Subjective: Patient feels okay and offers no complaints.  States he feels fine.  He asked if his wife Scott Montgomery was here.  Initially admitted on 10/24/2020 with mental status change and aspiration pneumonia.  Objective: Vitals:   10/31/20 2145 11/01/20 0440  BP: (!) 108/49 126/64  Pulse: 65 70  Resp: 18 18  Temp: 99.6 F (37.6 C) 98 F (36.7 C)  SpO2: 98% 98%    Intake/Output Summary (Last 24 hours) at 11/01/2020 1311 Last data filed at 11/01/2020 0449 Gross per 24 hour  Intake 245 ml  Output 50 ml  Net 195 ml   Filed Weights   10/20/20 1919 10/25/20 0300  Weight: 50 kg 57.4 kg    ROS: Review of Systems  Respiratory:  Negative for shortness of breath.   Cardiovascular:  Negative for chest pain.  Gastrointestinal:  Negative for abdominal pain.  Exam: Physical Exam HENT:     Head: Normocephalic.     Mouth/Throat:     Pharynx: No oropharyngeal exudate.  Eyes:     General: Lids are normal.     Conjunctiva/sclera: Conjunctivae normal.  Cardiovascular:     Rate and Rhythm: Normal rate and regular rhythm.     Heart sounds: Normal heart sounds, S1 normal and S2 normal.  Pulmonary:     Breath sounds: Normal breath sounds. No decreased breath sounds, wheezing, rhonchi or rales.  Abdominal:     Palpations: Abdomen is soft.     Tenderness: There is no abdominal tenderness.  Musculoskeletal:     Right lower leg: No swelling.     Left lower leg: No swelling.  Skin:    General: Skin is warm.     Findings: No rash.  Neurological:     Mental Status: He is alert.     Comments: Today able to answer some yes or no questions and follow commands and straight leg raise to command.      Scheduled Meds:  amLODipine  5 mg Oral Daily   cholecalciferol  2,000  Units Oral Daily   divalproex  125 mg Oral BID   enoxaparin (LOVENOX) injection  40 mg Subcutaneous Q24H   feeding supplement (NEPRO CARB STEADY)  237 mL Oral TID BM   FLUoxetine  20 mg Oral Daily   methylphenidate  5 mg Oral BID   risperiDONE  1 mg Oral QHS   senna-docusate  2 tablet Oral BID   sodium chloride flush  3 mL Intravenous Q12H   tamsulosin  0.4 mg Oral Daily   vitamin B-12  1,000 mcg Oral Daily   Continuous Infusions:  sodium chloride     Brief history.  Initially admitted 10/24/2020 with altered mental status and aspiration pneumonia and acute hypoxic respiratory failure.  Past medical history of dementia and hypertension.  Patient completed entire course of antibiotics here.  Being treated for failure to thrive and severe protein calorie malnutrition.  Transitional care team trying to look into placement options.  Assessment/Plan:  Aspiration pneumonia.  Completed entire course of antibiotics while here.  On dysphagia diet. Acute metabolic encephalopathy secondary to underlying dementia and aspiration pneumonia.  Patient was able to follow commands today and straight leg raise.  As needed Risperdal for agitation. Failure to thrive, severe protein calorie malnutrition.  Patient is a DO NOT  RESUSCITATE. Frequent falls Essential hypertension on low-dose Norvasc BPH on Flomax Acute hypoxic respiratory failure on admission has resolved and breathing on room air.  Pressure Injury 10/18/20 Arm Lower;Posterior;Right Deep Tissue Pressure Injury - Purple or maroon localized area of discolored intact skin or blood-filled blister due to damage of underlying soft tissue from pressure and/or shear. (Active)  10/18/20 2000  Location: Arm  Location Orientation: Lower;Posterior;Right (forearm)  Staging: Deep Tissue Pressure Injury - Purple or maroon localized area of discolored intact skin or blood-filled blister due to damage of underlying soft tissue from pressure and/or shear. (per  Wound care note 10/18/20)  Wound Description (Comments):   Present on Admission: Yes       Code Status:     Code Status Orders  (From admission, onward)           Start     Ordered   10/24/20 1528  Do not attempt resuscitation (DNR)  Continuous       Question Answer Comment  In the event of cardiac or respiratory ARREST Do not call a "code blue"   In the event of cardiac or respiratory ARREST Do not perform Intubation, CPR, defibrillation or ACLS   In the event of cardiac or respiratory ARREST Use medication by any route, position, wound care, and other measures to relive pain and suffering. May use oxygen, suction and manual treatment of airway obstruction as needed for comfort.   Comments Patient is a DNR      10/24/20 1528           Code Status History     Date Active Date Inactive Code Status Order ID Comments User Context   10/18/2020 1205 10/20/2020 1911 DNR [id number]  Tresa Moore, MD Inpatient   10/17/2020 1518 10/18/2020 1205 Full Code [id number]  Cox, Nadyne Coombes, DO ED      Advance Directive Documentation    Flowsheet Row Most Recent Value  Type of Advance Directive Healthcare Power of Attorney  Pre-existing out of facility DNR order (yellow form or pink MOST form) --  "MOST" Form in Place? --      Family Communication: Updated patient's wife on the phone Disposition Plan: Status is: Inpatient  Dispo: The patient is from: Home              Anticipated d/c is to: Memory care              Patient currently medically stable to go out to memory care once a place is found   Difficult to place patient.  Yes.  Time spent: 25 minutes  Deysha Cartier [employer]

## 2020-11-01 NOTE — Plan of Care (Signed)
  Problem: Clinical Measurements: Goal: Ability to maintain clinical measurements within normal limits will improve Outcome: Progressing Goal: Will remain free from infection Outcome: Progressing Goal: Diagnostic test results will improve Outcome: Progressing Goal: Respiratory complications will improve Outcome: Progressing Goal: Cardiovascular complication will be avoided Outcome: Progressing   Pt is calm and cooperative this shift. Oriented to person only. Has 1:1 sitter. V/S stable.No complaints of pain.

## 2020-11-02 NOTE — Progress Notes (Signed)
[facility]  - [place] at [facility]   PATIENT NAME: Scott Montgomery    MR#:  [id number]  DATE OF BIRTH:  January 29, 1930  SUBJECTIVE:  patient resting quietly. RN reported sitter had to be secondary patient being restless and trying to get out of bed.  REVIEW OF SYSTEMS:   Review of Systems  Unable to perform ROS: Medical condition  Tolerating Diet: no Tolerating PT: patient refusing, not participating  DRUG ALLERGIES:  No Known Allergies  VITALS:  Blood pressure 122/65, pulse 74, temperature 98.3 F (36.8 C), temperature source Oral, resp. rate 16, height 5\' 5"  (1.651 m), weight 57.4 kg, SpO2 96 %.  PHYSICAL EXAMINATION:   Physical Exam limited exam  GENERAL:  87 y.o.-year-old patient lying in the bed with no acute distress. Appears chronically ill, deconditioned LUNGS: Normal breath sounds bilaterally, no wheezing, rales, rhonchi. CARDIOVASCULAR: S1, S2 normal. No murmurs, rubs, or gallops.  ABDOMEN: Soft, nontender, nondistended.  EXTREMITIES: No cyanosis, clubbing or edema b/l.    NEUROLOGIC: opens eyes to verbal command.alert c PSYCHIATRIC:  patient lethargic , answers some quesitons   Pressure Injury 10/18/20 Arm Lower;Posterior;Right Deep Tissue Pressure Injury - Purple or maroon localized area of discolored intact skin or blood-filled blister due to damage of underlying soft tissue from pressure and/or shear. (Active)  10/18/20 2000  Location: Arm  Location Orientation: Lower;Posterior;Right (forearm)  Staging: Deep Tissue Pressure Injury - Purple or maroon localized area of discolored intact skin or blood-filled blister due to damage of underlying soft tissue from pressure and/or shear. (per Wound care note 10/18/20)  Wound Description (Comments):   Present on Admission: Yes    LABORATORY PANEL:  CBC No results for input(s): WBC, HGB, HCT, PLT in the last 168 hours.   Chemistries  Recent Labs  Lab 10/31/20 0555  CREATININE 0.72    Cardiac  Enzymes No results for input(s): TROPONINI in the last 168 hours. RADIOLOGY:  No results found. ASSESSMENT AND PLAN:   87 y.o. male  with past medical history of HTN/HLD, dementia, BPH, anxiety, recently discharged from hospital to ALF after admission for a fall with rhabdomyolysis and dementia with behavior disturbance admitted on 10/20/2020 with aspiration pneumonia, fall, protein calorie malnutrition likely secondary to underlying dementia and poor by mouth intake.  Aspiration pneumonia with acute hypoxic respiratory failure -- patient currently on room air. No respiratory distress. -- will start pured diet -- IV unasyn --change to po augmentin--completed rx -- white count normal.  acute metabolic encephalopathy secondary to underlying dementia with aspiration pneumonia. -- patient unable to hold meaningful conversation remains lethargic and intermittently answers questions --remains calm-- advised nurse to discontinue sitter before the end of the shift today.  failure to thrive protein calorie malnutrition poor PO intake Falls -- overall has a poor prognosis. Patient seen by palliative care. -- they have discussed with patient's wife Domonique Brouillard -- patient is DNR -- wife understands poor prognosis. -- she is considering hospice at discharge.  falls secondary to generalized ability and deconditioning -- patient is not participating with PT. Suspect this is due to dementia -- I have resumed most of patient's home meds. He has been on and off refusing oral meds  patient was seen by palliative care. Discussion was made by palliative care nurse practitioner with wife and their requesting hospice services at discharge.  Procedures: Family communication :spoke with Mitzi Hansen Umble  Consults : palliative care CODE STATUS: DNR DNI DVT Prophylaxis : enoxaparin Level of care: Med-Surg Status is:  Inpatient  Remains inpatient appropriate because:Inpatient level of care appropriate  due to severity of illness  Dispo: The patient is from: ALF              Anticipated d/c is to:  TBD              Patient currently medically optimized for discharge   difficult to place patient yes!!   awaiting memory care unit bed availability       TOTAL TIME TAKING CARE OF THIS PATIENT: 20 minutes.  >50% time spent on counselling and coordination of care  Note: This dictation was prepared with Dragon dictation along with smaller phrase technology. Any transcriptional errors that result from this process are unintentional.  Enedina Finner M.D    [facility]   CC: Primary care physician; [facility] Patient ID: Scott Montgomery, male   DOB: February 28, 1930, 87 y.o.   MRN: [id number]

## 2020-11-02 NOTE — Evaluation (Signed)
Occupational Therapy Re-evaluation Patient Details Name: Scott Montgomery MRN: [id number] DOB: Oct 24, 1929 Today's Date: 11/02/2020    History of Present Illness Scott Montgomery is a 87 y.o. male with dementia who comes in from ALF after a fall.  Patient was discharged from [facility] to [facility] on 10/20/20 after workup for UTI.  Upon arrival to [facility] within a 1 hour period patient had  unwitnessed fall. Staff brought him back to the ED for evaluation. Imaging negative for acute abnormality.   Clinical Impression   Pt seen for OT re-evaluation on this date in setting of prolonged admission. Upon arrival to room, pt awake and seated upright in bed with mitts donned. Pt A&Ox2 and reporting no pain. Pt agreeable to OT tx. Pt continues to present with decreased safety awareness, decreased awareness of deficits, decreased activity tolerance, and decreased balance. Due to these impairments, pt requires MIN GUARD for bed mobility, SUPERVISION/SET-UP for seated grooming & hygiene tasks at EOB, and MIN A for sit<>stand transfers. Pt was also able to take 10 total steps forward/backwards/laterally with RW, however declined further mobility and ADLs. Pt is making good progress towards goals; 2/4 goals have been met and upgraded and pt is steadily progressing towards remaining two goals. Pt continues to benefit from skilled OT services to maximize return to PLOF and minimize risk of future falls, injury, and readmission. Discharge recommendation remains appropriate.      Follow Up Recommendations  SNF;Supervision/Assistance - 24 hour    Equipment Recommendations  Other (comment) (defer to next venue of care)       Precautions / Restrictions Precautions Precautions: Fall Restrictions Weight Bearing Restrictions: No      Mobility Bed Mobility Overal bed mobility: Needs Assistance Bed Mobility: Supine to Sit;Sit to Supine     Supine to sit: Min guard;HOB elevated Sit to supine:  Min guard        Transfers Overall transfer level: Needs assistance Equipment used: Rolling walker (2 wheeled) Transfers: Sit to/from Stand Sit to Stand: Min assist              Balance Overall balance assessment: Needs assistance Sitting-balance support: Bilateral upper extremity supported;Feet supported Sitting balance-Leahy Scale: Fair Sitting balance - Comments: Fair sitting balance at EOB during grooming & hygiene tasks. Requires MIN GUARD in setting of implusivity Postural control: Right lateral lean Standing balance support: Bilateral upper extremity supported;During functional activity Standing balance-Leahy Scale: Poor Standing balance comment: with UE support from RW, pt required MIN A to walk within room                           ADL either performed or assessed with clinical judgement   ADL Overall ADL's : Needs assistance/impaired     Grooming: Wash/dry face;Applying deodorant;Supervision/safety;Set up;Sitting                               Functional mobility during ADLs: Minimal assistance;Rolling walker;Cueing for safety        Pertinent Vitals/Pain Pain Assessment: No/denies pain              Cognition Arousal/Alertness: Awake/alert Behavior During Therapy: Impulsive Overall Cognitive Status: History of cognitive impairments - at baseline                                 General  Comments: A&Ox2. Pt impulsive, requiring multi-modal cues throughout     OT Goals(Current goals can be found in the care plan section) Acute Rehab OT Goals Patient Stated Goal: to shave face OT Goal Formulation: With patient Time For Goal Achievement: 11/16/20 Potential to Achieve Goals: Fair ADL Goals Pt Will Perform Grooming: with supervision;with set-up;standing Pt Will Transfer to Toilet: bedside commode;with min guard assist;stand pivot transfer  OT Frequency: Min 1X/week    AM-PAC OT "6 Clicks" Daily Activity      Outcome Measure Help from another person eating meals?: A Little Help from another person taking care of personal grooming?: A Little Help from another person toileting, which includes using toliet, bedpan, or urinal?: A Lot Help from another person bathing (including washing, rinsing, drying)?: A Lot Help from another person to put on and taking off regular upper body clothing?: A Little Help from another person to put on and taking off regular lower body clothing?: A Lot 6 Click Score: 15   End of Session Equipment Utilized During Treatment: Gait belt;Rolling walker Nurse Communication: Mobility status  Activity Tolerance: Patient tolerated treatment well Patient left: in bed;with call bell/phone within reach;with bed alarm set;Other (comment);with nursing/sitter in room (with mitts donned)  OT Visit Diagnosis: Other abnormalities of gait and mobility (R26.89);Muscle weakness (generalized) (M62.81);Repeated falls (R29.6)                Time: 7471-5953 OT Time Calculation (min): 27 min Charges:  OT General Charges $OT Visit: 1 Visit OT Evaluation $OT Re-eval: 1 Re-eval OT Treatments $Self Care/Home Management : 23-37 mins  Fredirick Maudlin, OTR/L [place]

## 2020-11-02 NOTE — TOC Progression Note (Signed)
Transition of Care [facility]) - Progression Note    Patient Details  Name: Bryceton Hantz MRN: [id number] Date of Birth: [birth year]  Transition [facility]) CM/SW Contact  Maree Krabbe, LCSW Phone Number: 11/02/2020, 2:25 PM  Clinical Narrative:   [name] from [facility] is out of the office, the receptionist asked that CSW fax referral to her and she would get it to Child psychotherapist. Referral faxed. CSW also called and left Alona Bene from [facility] a voicemail.    Expected Discharge Plan: Memory Care Barriers to Discharge: ED Patient Insisting on an Alternate Living Situation/Facility  Expected Discharge Plan and Services Expected Discharge Plan: Memory Care       Living arrangements for the past 2 months: Assisted Living Facility                                       Social Determinants of Health (SDOH) Interventions    Readmission Risk Interventions No flowsheet data found.

## 2020-11-03 DIAGNOSIS — R627 Adult failure to thrive: Secondary | ICD-10-CM

## 2020-11-03 LAB — RESP PANEL BY RT-PCR (FLU A&B, COVID) ARPGX2
Influenza A by PCR: NEGATIVE
Influenza B by PCR: NEGATIVE
SARS Coronavirus 2 by RT PCR: NEGATIVE

## 2020-11-03 MED ORDER — COVID-19 MRNA VAC-TRIS(PFIZER) 30 MCG/0.3ML IM SUSP
0.3000 mL | Freq: Once | INTRAMUSCULAR | Status: AC
  Administered 2020-11-03: 0.3 mL via INTRAMUSCULAR
  Filled 2020-11-03: qty 0.3

## 2020-11-03 MED ORDER — RISPERIDONE 0.5 MG PO TABS: 0.5000 mg | ORAL_TABLET | Freq: Two times a day (BID) | ORAL | 0 refills | Status: AC | PRN

## 2020-11-03 MED ORDER — AMLODIPINE BESYLATE 5 MG PO TABS: 5.0000 mg | ORAL_TABLET | Freq: Every day | ORAL | 0 refills | Status: AC

## 2020-11-03 NOTE — Progress Notes (Signed)
Palliative:  Mr. Hewitt, "Scott Montgomery" is resting quietly in bed.  Scott Montgomery appears chronically ill and frail. He has known dementia and is able to tell me his name, but not where we are.  I'm not sure that he can make his basic needs known.  Nursing staff is at bedside feeding.    Scott Montgomery is able to eat Magic cup and drink protein shake without s/s of aspiration.  He has poor appetite and malnutrition related to advancing memory loss.   Plan: Continue to treat the treatable, but no CPR or intubation.  DC to [facility]. Outpatient hospice to follow.   25 minutes  Lillia Carmel, NP Palliative Medicine Team  Team Phone [phone] Greater than 50% of this time was spent counseling and coordinating care related to the above assessment and plan.

## 2020-11-03 NOTE — Discharge Summary (Signed)
[facility] - [place] at [facility]   PATIENT NAME: Scott Montgomery    MR#:  [id number]  DATE OF BIRTH:  1929/05/04  DATE OF ADMISSION:  10/20/2020 ADMITTING PHYSICIAN: Scott Shutters, MD  DATE OF DISCHARGE: 11/03/2020  PRIMARY CARE PHYSICIAN: [facility]    ADMISSION DIAGNOSIS:  Aspiration pneumonia (HCC) [J69.0] Fall, initial encounter [W19.XXXA] Dementia without behavioral disturbance, unspecified dementia type (HCC) [F03.90]  DISCHARGE DIAGNOSIS:  Aspiration Pneumonia--completed rx Dementia  SECONDARY DIAGNOSIS:   Past Medical History:  Diagnosis Date  . Dementia (HCC)   . Hypertension     HOSPITAL COURSE:  87 y.o. male  with past medical history of HTN/HLD, dementia, BPH, anxiety, recently discharged from hospital to ALF after admission for a fall with rhabdomyolysis and dementia with behavior disturbance admitted on 10/20/2020 with aspiration pneumonia, fall, protein calorie malnutrition likely secondary to underlying dementia and poor by mouth intake.   Aspiration pneumonia with acute hypoxic respiratory failure--resolved -- patient currently on room air. No respiratory distress. -- tolerating  pured diet -- received IV unasyn --changed  to po augmentin--completed rx -- white count normal.   acute metabolic encephalopathy secondary to underlying dementia with aspiration pneumonia. -- patient unable to hold meaningful conversation however  intermittently answers questions --remains calm   failure to thrive protein calorie malnutrition poor PO intake Falls -- overall has a poor prognosis. Patient seen by palliative care. -- they have discussed with patient's wife Scott Montgomery -- patient is DNR -- wife understands poor prognosis. -- she is requesting hospice at discharge at SNF   falls secondary to generalized ability and deconditioning -- patient is not participating with PT. Suspect this is due to dementia --  resumed most of  patient's home meds. He has been on and off refusing oral meds   patient was seen by palliative care. Discussion was made by palliative care nurse practitioner with wife and their requesting hospice services at discharge.   Family communication :pt's wife and DIL aware of d/c plans Consults : palliative care CODE STATUS: DNR DNI DVT Prophylaxis : enoxaparin Level of care: Med-Surg Status is: Inpatient     Dispo:  patient is from: ALF              Anticipated d/c is to: rehab in [place] today              Patient currently medically optimized for discharge. Per TOC [place]            Pt will get Pfizer COVID booster vaccine before discharge.    CONSULTS OBTAINED:    DRUG ALLERGIES:  No Known Allergies  DISCHARGE MEDICATIONS:   Allergies as of 11/03/2020   No Known Allergies      Medication List     STOP taking these medications    traZODone 100 MG tablet Commonly known as: DESYREL       TAKE these medications    acetaminophen 325 MG tablet Commonly known as: TYLENOL Take 650 mg by mouth 3 (three) times daily.   amLODipine 5 MG tablet Commonly known as: NORVASC Take 1 tablet (5 mg total) by mouth daily. Start taking on: November 04, 2020   cholecalciferol 25 MCG (1000 UNIT) tablet Commonly known as: VITAMIN D Take 2,000 Units by mouth daily.   divalproex 125 MG capsule Commonly known as: DEPAKOTE SPRINKLE Take 125 mg by mouth 2 (two) times daily.   FLUoxetine 20 MG capsule Commonly known as:  PROZAC Take 20 mg by mouth daily.   methylphenidate 5 MG tablet Commonly known as: RITALIN Take 5 mg by mouth 2 (two) times daily.   risperiDONE 1 MG tablet Commonly known as: RISPERDAL Take 1 mg by mouth at bedtime. What changed: Another medication with the same name was added. Make sure you understand how and when to take each.   risperiDONE 0.5 MG tablet Commonly known as: RISPERDAL Take 1 tablet (0.5 mg total) by mouth 2 (two) times daily  between meals as needed (agitation). What changed: You were already taking a medication with the same name, and this prescription was added. Make sure you understand how and when to take each.   senna-docusate 8.6-50 MG tablet Commonly known as: Senokot-S Take 2 tablets by mouth 2 (two) times daily.   tamsulosin 0.4 MG Caps capsule Commonly known as: FLOMAX Take 0.4 mg by mouth.   vitamin B-12 1000 MCG tablet Commonly known as: CYANOCOBALAMIN Take 1,000 mcg by mouth daily.               Discharge Care Instructions  (From admission, onward)           Start     Ordered   11/03/20 0000  Discharge wound care:       Comments: Wound  Duration          Pressure Injury 10/18/20 Arm Lower;Posterior;Right Deep Tissue Pressure Injury - Purple or maroon localized area of discolored intact skin or blood-filled blister due to damage of underlying soft tissue from pressure and/or shear. 15 days    Wound / Incision (Open or Dehisced) 10/18/20 Knee Anterior;Left 15 days    Wound / Incision (Open or Dehisced) 10/18/20 Knee Anterior;Right 15 days    Wound / Incision (Open or Dehisced) 10/18/20 Other (Comment) Foot Right;Lateral open sore 15 days    Wound / Incision (Open or Dehisced) 10/28/20 Skin tear Arm Left;Lower;Posterior;Proximal scabbed area   11/03/20 1009            If you experience worsening of your admission symptoms, develop shortness of breath, life threatening emergency, suicidal or homicidal thoughts you must seek medical attention immediately by calling 911 or calling your MD immediately  if symptoms less severe.  You Must read complete instructions/literature along with all the possible adverse reactions/side effects for all the Medicines you take and that have been prescribed to you. Take any new Medicines after you have completely understood and accept all the possible adverse reactions/side effects.   Please note  You were cared for by a hospitalist during your  hospital stay. If you have any questions about your discharge medications or the care you received while you were in the hospital after you are discharged, you can call the unit and asked to speak with the hospitalist on call if the hospitalist that took care of you is not available. Once you are discharged, your primary care physician will handle any further medical issues. Please note that NO REFILLS for any discharge medications will be authorized once you are discharged, as it is imperative that you return to your primary care physician (or establish a relationship with a primary care physician if you do not have one) for your aftercare needs so that they can reassess your need for medications and monitor your lab values. Today   SUBJECTIVE   Remains calm. Took his meds this am VITAL SIGNS:  Blood pressure (!) 133/59, pulse 71, temperature (!) 97.2 F (36.2 C), resp. rate 18,  height 5\' 5"  (1.651 m), weight 57.4 kg, SpO2 95 %.  I/O:   Intake/Output Summary (Last 24 hours) at 11/03/2020 1012 Last data filed at 11/02/2020 1859 Gross per 24 hour  Intake 360 ml  Output --  Net 360 ml    PHYSICAL EXAMINATION:  GENERAL:  87 y.o.-year-old patient lying in the bed with no acute distress. Appears chronically fatigued and deconditioned LUNGS: Normal breath sounds bilaterally, no wheezing, rales, rhonchi. CARDIOVASCULAR: S1, S2 normal. No murmurs, rubs, or gallops.  ABDOMEN: Soft, nontender, nondistended.  EXTREMITIES: No cyanosis, clubbing or edema b/l.    NEUROLOGIC: opens eyes to verbal command.alert  and intermittently has conversations Calm   Pressure Injury 10/18/20 Arm Lower;Posterior;Right Deep Tissue Pressure Injury - Purple or maroon localized area of discolored intact skin or blood-filled blister due to damage of underlying soft tissue from pressure and/or shear. (Active)  10/18/20 2000  Location: Arm  Location Orientation: Lower;Posterior;Right (forearm)  Staging: Deep Tissue  Pressure Injury - Purple or maroon localized area of discolored intact skin or blood-filled blister due to damage of underlying soft tissue from pressure and/or shear. (per Wound care note 10/18/20)  Wound Description (Comments):   Present on Admission: Yes   DATA REVIEW:   CBC  No results for input(s): WBC, HGB, HCT, PLT in the last 168 hours.  Chemistries  Recent Labs  Lab 10/31/20 0555  CREATININE 0.72    Microbiology Results   Recent Results (from the past 240 hour(s))  Blood culture (routine x 2)     Status: None   Collection Time: 10/24/20  1:34 PM   Specimen: BLOOD  Result Value Ref Range Status   Specimen Description BLOOD BLOOD RIGHT WRIST  Final   Special Requests   Final    BOTTLES DRAWN AEROBIC AND ANAEROBIC Blood Culture adequate volume   Culture   Final    NO GROWTH 5 DAYS Performed at [facility], [address] [place], [place]    Report Status 10/29/2020 FINAL  Final  Blood culture (routine x 2)     Status: None   Collection Time: 10/24/20  1:35 PM   Specimen: BLOOD  Result Value Ref Range Status   Specimen Description BLOOD BLOOD RIGHT FOREARM  Final   Special Requests   Final    BOTTLES DRAWN AEROBIC AND ANAEROBIC Blood Culture results may not be optimal due to an inadequate volume of blood received in culture bottles   Culture   Final    NO GROWTH 5 DAYS Performed at [facility], [address], [place], [place]    Report Status 10/29/2020 FINAL  Final  MRSA Next Gen by PCR, Nasal     Status: None   Collection Time: 10/25/20  4:25 AM   Specimen: Nasal Mucosa; Nasal Swab  Result Value Ref Range Status   MRSA by PCR Next Gen NOT DETECTED NOT DETECTED Final    Comment: (NOTE) The GeneXpert MRSA Assay (FDA approved for NASAL specimens only), is one component of a comprehensive MRSA colonization surveillance program. It is not intended to diagnose MRSA infection nor to guide or monitor treatment for MRSA  infections. Test performance is not FDA approved in patients less than 28 years old. Performed at [facility], [address], [place], [place]     RADIOLOGY:  No results found.   CODE STATUS:     Code Status Orders  (From admission, onward)  Start     Ordered   10/24/20 1528  Do not attempt resuscitation (DNR)  Continuous       Question Answer Comment  In the event of cardiac or respiratory ARREST Do not call a "code blue"   In the event of cardiac or respiratory ARREST Do not perform Intubation, CPR, defibrillation or ACLS   In the event of cardiac or respiratory ARREST Use medication by any route, position, wound care, and other measures to relive pain and suffering. May use oxygen, suction and manual treatment of airway obstruction as needed for comfort.   Comments Patient is a DNR      10/24/20 1528           Code Status History     Date Active Date Inactive Code Status Order ID Comments User Context   10/18/2020 1205 10/20/2020 1911 DNR [id number]  Tresa Moore, MD Inpatient   10/17/2020 1518 10/18/2020 1205 Full Code [id number]  Cox, Nadyne Coombes, DO ED      Advance Directive Documentation    Flowsheet Row Most Recent Value  Type of Advance Directive Healthcare Power of Attorney  Pre-existing out of facility DNR order (yellow form or pink MOST form) --  "MOST" Form in Place? --        TOTAL TIME TAKING CARE OF THIS PATIENT: 35 minutes.    Enedina Finner M.D  [facility]    CC: Primary care physician; [facility]

## 2020-11-03 NOTE — Progress Notes (Signed)
  Speech Language Pathology Treatment: Dysphagia  Patient Details Name: Scott Montgomery MRN: [id number] DOB: Dec 02, 1929 Today's Date: 11/03/2020 Time: 5940-9050 SLP Time Calculation (min) (ACUTE ONLY): 10 min  Assessment / Plan / Recommendation Clinical Impression  Pt seen for ongoing dysphagia management. Pt was initially asleep but easily aroused for safe intake. Skilled observation of pt consuming thin liquids via straw was provided. Pt was free of any overt s/s of aspiration. Of note, pt has been consuming thin liquids for ~ 1 week without any respiratory compromise. At this time, skilled ST intervention is no longer indicated. Will defer to next venue of care for further trials of upgraded food.    HPI HPI: Pt  is a 87 y.o. male with medical history significant for Dementia -- lives in a facility, hyperlipidemia, BPH, hypertension, cataracts, history of colon polyps, generalized anxiety disorder, osteoarthritis, pseudophakia, presents to the emergency department from facility for chief concerns of altered mentation.  Initially at admit, he was alert to name, age. He was agitated attempting to take out his lines and IV requiring bilateral mittens for the confusion.  SW is attempting to place pt at a more skilled level facility which is taking a longer time - pt remains in the ED, not admitted.  During this time, NSG has reported some difficulty w/ swallowing last evening.  Pt has been seen by OT/PT Rehab.  CXR on 10/20/2020: No acute chest findings. 2. Unchanged left posterior sixth rib fracture.      SLP Plan  All goals met       Recommendations  Diet recommendations: Dysphagia 1 (puree);Thin liquid Liquids provided via: Straw Medication Administration: Crushed with puree Supervision: Full supervision/cueing for compensatory strategies;Staff to assist with self feeding Compensations: Minimize environmental distractions;Slow rate;Small sips/bites Postural Changes and/or Swallow  Maneuvers: Seated upright 90 degrees;Upright 30-60 min after meal                Oral Care Recommendations: Oral care BID Follow up Recommendations: Skilled Nursing facility SLP Visit Diagnosis: Dysphagia, oropharyngeal phase (R13.12) Plan: All goals met       GO               Desirea Mizrahi B. Rutherford Nail M.S., CCC-SLP, [place] Office [phone]  Stormy Fabian 11/03/2020, 1:45 PM

## 2020-11-03 NOTE — Progress Notes (Signed)
Mobility Specialist - Progress Note   11/03/20 1100  Mobility  Activity Refused mobility  Mobility performed by Mobility specialist    Pt sleeping on arrival. Pt opens eyes to sound of voice, but quickly returns to sleep without further acknowledgement of mobility tech's presence. Will attempt session another date/time if appropriate, pt expecting d/c later this date.    Filiberto Pinks Mobility Specialist 11/03/20, 11:26 AM

## 2020-11-03 NOTE — TOC Progression Note (Addendum)
Transition of Care [facility]) - Progression Note    Patient Details  Name: Scott Montgomery MRN: [id number] Date of Birth: 11-Oct-1929  Transition of Care [facility]) CM/SW Contact  Maree Krabbe, LCSW Phone Number: 11/03/2020, 2:41 PM  Clinical Narrative:   [facility] can not transport pt as pt is demented and not alert and oriented. CSW also tried [employer]. [employer] states they would need a "promise to pay" contract stating [facility] would cover what insurance would not and they would not be able to pick up pt until 11:00 PM. [facility] has no availability for the next two days. CSW and [facility] Director will continue to look for options.     Expected Discharge Plan: Memory Care Barriers to Discharge: ED Patient Insisting on an Alternate Living Situation/Facility  Expected Discharge Plan and Services Expected Discharge Plan: Memory Care       Living arrangements for the past 2 months: Assisted Living Facility Expected Discharge Date: 11/03/20                                     Social Determinants of Health (SDOH) Interventions    Readmission Risk Interventions No flowsheet data found.

## 2020-11-03 NOTE — TOC Progression Note (Addendum)
Transition of Care [facility]) - Progression Note    Patient Details  Name: Doyt Castellana MRN: [id number] Date of Birth: 09/12/1929  Transition [facility] Care [facility]) CM/SW Contact  Maree Krabbe, LCSW Phone Number: 11/03/2020, 10:04 AM  Clinical Narrative:   [facility] in [place] has offered pt a bed today. CSW confirmed with Wonda Cerise at the [facility] that pt has had both the 1st and 2nd Pfizer vaccine. Pt is eligable for the first booster and in that case would not have to go to quarantine if he gets it here before dc. [name] spouse defers all questions to daughter in law and daughter in law consents to booster.    Expected Discharge Plan: Memory Care Barriers to Discharge: ED Patient Insisting on an Alternate Living Situation/Facility  Expected Discharge Plan and Services Expected Discharge Plan: Memory Care       Living arrangements for the past 2 months: Assisted Living Facility                                       Social Determinants of Health (SDOH) Interventions    Readmission Risk Interventions No flowsheet data found.

## 2020-11-04 NOTE — Progress Notes (Signed)
Report called to Luetta Nutting, RN at [employer] at [facility].

## 2020-11-04 NOTE — Progress Notes (Signed)
Nutrition Follow Up Note   DOCUMENTATION CODES:   Severe malnutrition in context of social or environmental circumstances  INTERVENTION:   Nepro Shake po TID, each supplement provides 425 kcal and 19 grams protein  Magic cup TID with meals, each supplement provides 290 kcal and 9 grams of protein  NUTRITION DIAGNOSIS:   Severe Malnutrition related to social / environmental circumstances (dementia, advanced age) as evidenced by severe fat depletion, severe muscle depletion.  GOAL:   Patient will meet greater than or equal to 90% of their needs -progressing   MONITOR:   PO intake, Supplement acceptance, Labs, Weight trends, Skin, I & O's  ASSESSMENT:   87 y.o. male  with past medical history of HTN, HLD, dementia, BPH, anxiety, recently discharged from hospital to ALF after admission for a fall with rhabdomyolysis and dementia with behavior disturbance admitted on 10/20/2020 with aspiration pneumonia and fall.  Pt continues to have fairly good appetite and oral intake; pt eating 50-100% of meals in hospital and is drinking the Nepro supplements. Pt seen by SLP 8/16 and was placed on a dysphagia 1/thin liquid diet. No new weight since 8/7. Pt to discharge to SNF today.   Medications reviewed and include: D3, lovenox, senokot, B12  Labs reviewed: none recent   Diet Order:    Diet Order             Diet - low sodium heart healthy           DIET - DYS 1 Room service appropriate? Yes; Fluid consistency: Thin  Diet effective now                  EDUCATION NEEDS:   No education needs have been identified at this time  Skin:  Skin Assessment: Reviewed RN Assessment (wounds arm, R foot and L & R knees)  Last BM:  8/17- type 5  Height:   Ht Readings from Last 1 Encounters:  10/25/20 5\' 5"  (1.651 m)    Weight:   Wt Readings from Last 1 Encounters:  10/25/20 57.4 kg    Ideal Body Weight:  61.8 kg  BMI:  Body mass index is 21.05 kg/m.  Estimated Nutritional  Needs:   Kcal:  1600-1800kcal/day  Protein:  80-90g/day  Fluid:  1.5-1.7L/day  12/25/20 MS, RD, LDN Please refer to [facility] for RD and/or RD on-call/weekend/after hours pager

## 2020-11-04 NOTE — Progress Notes (Signed)
Occupational Therapy Treatment Patient Details Name: Scott Montgomery MRN: [id number] DOB: [birth year] Today's Date: 11/04/2020    History of present illness Scott Montgomery is a 87 y.o. male with dementia who comes in from ALF after a fall.  Patient was discharged from [facility] to [facility] on 10/20/20 after workup for UTI.  Upon arrival to [facility] within a 1 hour period patient had  unwitnessed fall. Staff brought him back to the ED for evaluation. Imaging negative for acute abnormality.   OT comments  Pt seen for OT treatment on this date. Upon arrival to room, pt asleep in bed, however easily awoken. Pt initially declining OT tx and requesting to finish nap. Pt educated on benefits of maintaining regular sleep/wake schedule and this author encouraged pt to remain awake until lunch. Pt declining OOB mobility, however agreeable to bed-level ADLs and therapy exercises. Pt continues to requires SUPERVISION for bed mobility and SUPERVISION/SET-UP for seated grooming tasks. While seated EOB, pt engaged in UE/LE therapy exercises consisting of shoulder flexion, shoulder horizontal abduction, elbow flexion, and long arc quad. At end of session, pt left in bed in no acute distress. Pt continues to benefit from skilled OT services to maximize return to PLOF and minimize risk of future falls, injury, caregiver burden, and readmission. Will continue to follow POC. Discharge recommendation remains appropriate.     Follow Up Recommendations  SNF;Supervision/Assistance - 24 hour    Equipment Recommendations  Other (comment) (defer to next venue of care)       Precautions / Restrictions Precautions Precautions: Fall Restrictions Weight Bearing Restrictions: No       Mobility Bed Mobility Overal bed mobility: Needs Assistance Bed Mobility: Supine to Sit;Sit to Supine     Supine to sit: Supervision Sit to supine: Supervision   General bed mobility comments: no physical assist  provided this date. Requires increase time/effort    Transfers                 General transfer comment: Pt declined OOB mobility this date    Balance Overall balance assessment: Needs assistance Sitting-balance support: Bilateral upper extremity supported;Feet supported Sitting balance-Leahy Scale: Fair Sitting balance - Comments: Fair sitting balance at EOB during seated therapy exercises                                   ADL either performed or assessed with clinical judgement   ADL Overall ADL's : Needs assistance/impaired     Grooming: Wash/dry face;Supervision/safety;Set up;Sitting                                        Cognition Arousal/Alertness: Awake/alert Behavior During Therapy: WFL for tasks assessed/performed Overall Cognitive Status: History of cognitive impairments - at baseline                                 General Comments: A&Ox2. Pt requires multi-modal cues throughout        Exercises General Exercises - Upper Extremity Shoulder Flexion: Strengthening;Both;10 reps;Supine Shoulder Horizontal ABduction: Strengthening;Both;10 reps;Seated Shoulder Horizontal ADduction: Strengthening;Both;10 reps;Seated Elbow Flexion: Strengthening;Both;10 reps;Seated General Exercises - Lower Extremity Long Arc Quad: Strengthening;20 reps;Seated Hip Flexion/Marching: Strengthening;20 reps;Supine           Pertinent Vitals/  Pain       Pain Assessment: No/denies pain         Frequency  Min 1X/week        Progress Toward Goals  OT Goals(current goals can now be found in the care plan section)  Progress towards OT goals: Progressing toward goals  Acute Rehab OT Goals Patient Stated Goal: to go back to bed OT Goal Formulation: With patient Time For Goal Achievement: 11/16/20 Potential to Achieve Goals: Fair  Plan Discharge plan remains appropriate;Frequency remains appropriate       AM-PAC OT "6  Clicks" Daily Activity     Outcome Measure   Help from another person eating meals?: A Little Help from another person taking care of personal grooming?: A Little Help from another person toileting, which includes using toliet, bedpan, or urinal?: A Lot Help from another person bathing (including washing, rinsing, drying)?: A Lot Help from another person to put on and taking off regular upper body clothing?: A Little Help from another person to put on and taking off regular lower body clothing?: A Lot 6 Click Score: 15    End of Session    OT Visit Diagnosis: Other abnormalities of gait and mobility (R26.89);Muscle weakness (generalized) (M62.81);Repeated falls (R29.6)   Activity Tolerance Patient tolerated treatment well   Patient Left in bed;with call bell/phone within reach;with bed alarm set   Nurse Communication Mobility status        Time: 7681-1572 OT Time Calculation (min): 13 min  Charges: OT General Charges $OT Visit: 1 Visit OT Treatments $Therapeutic Activity: 8-22 mins  Scott Montgomery, OTR/L [facility] [phone]

## 2020-11-04 NOTE — TOC Transition Note (Addendum)
Transition of Care [facility]) - CM/SW Discharge Note   Patient Details  Name: Keeven Matty MRN: [id number] Date of Birth: Mar 11, 1930  Transition [facility] Care [facility]) CM/SW Contact:  Maree Krabbe, LCSW Phone Number: 11/04/2020, 9:09 AM   Clinical Narrative:  [name] Spouse ask that CSW speak to [name] daughter in law in order to organize dc. [facility] can transport pt tonight at 8:00. [name] daughter in law has agreed to pay upfront. [name] daughter in law to call and arrange payment. MD updated.  Facility updated.  CSW confirmed with facility that pt can admit at 10:30 tonight-- [name] states yes.   Final next level of care: Skilled Nursing Facility Barriers to Discharge: No Barriers Identified   Patient Goals and CMS Choice Patient states their goals for this hospitalization and ongoing recovery are:: Get to a safe place      Discharge Placement              Patient chooses bed at:  ([facility]) Patient to be transferred to facility by: [facility] Name of family member notified: Johnny Bridge Patient and family notified of of transfer: 11/04/20  Discharge Plan and Services                                     Social Determinants of Health (SDOH) Interventions     Readmission Risk Interventions No flowsheet data found.

## 2020-11-04 NOTE — Progress Notes (Signed)
Attempted to call report to [facility]

## 2020-11-04 NOTE — Progress Notes (Signed)
Pt transported to receiving facility.

## 2020-11-04 NOTE — Discharge Summary (Signed)
[facility] [place] [facility]   PATIENT NAME: Tab Rylee    MR#:  [id number]  DATE OF BIRTH:  [birth year]  DATE OF ADMISSION:  10/20/2020 ADMITTING PHYSICIAN: Lucile Shutters, MD  DATE OF DISCHARGE: 11/03/2020  PRIMARY CARE PHYSICIAN: [facility]    ADMISSION DIAGNOSIS:  Aspiration pneumonia (HCC) [J69.0] Fall, initial encounter [W19.XXXA] Dementia without behavioral disturbance, unspecified dementia type (HCC) [F03.90]  DISCHARGE DIAGNOSIS:  Aspiration Pneumonia--completed rx Dementia  SECONDARY DIAGNOSIS:   Past Medical History:  Diagnosis Date  . Dementia (HCC)   . Hypertension     HOSPITAL COURSE:  87 y.o. male  with past medical history of HTN/HLD, dementia, BPH, anxiety, recently discharged from hospital to ALF after admission for a fall with rhabdomyolysis and dementia with behavior disturbance admitted on 10/20/2020 with aspiration pneumonia, fall, protein calorie malnutrition likely secondary to underlying dementia and poor by mouth intake.   Aspiration pneumonia with acute hypoxic respiratory failure--resolved -- patient currently on room air. No respiratory distress. -- tolerating  pured diet -- received IV unasyn --changed  to po augmentin--completed rx -- white count normal.   acute metabolic encephalopathy secondary to underlying dementia with aspiration pneumonia. -- patient unable to hold meaningful conversation however  intermittently answers questions --remains calm   failure to thrive protein calorie malnutrition poor PO intake Falls -- overall has a poor prognosis. Patient seen by palliative care. -- they have discussed with patient's wife Irvin Bastin -- patient is DNR -- wife understands poor prognosis. -- she is requesting hospice at discharge at SNF   falls secondary to generalized ability and deconditioning -- patient is not participating with PT. Suspect this is due to dementia --  resumed most of  patient's home meds. He has been on and off refusing oral meds   patient was seen by palliative care. Discussion was made by palliative care nurse practitioner with wife and their requesting hospice services at discharge.   Family communication :pt's wife and DIL aware of d/c plans Consults : palliative care CODE STATUS: DNR DNI DVT Prophylaxis : enoxaparin Level of care: Med-Surg Status is: Inpatient     Dispo:  patient is from: ALF              Anticipated d/c is to: rehab in [place] today              Patient currently medically optimized for discharge. Per TOC [place]            Pt got Pfizer COVID booster vaccine before discharge.  Pt will be transported at 8 pm per [facility]    CONSULTS OBTAINED:    DRUG ALLERGIES:  No Known Allergies  DISCHARGE MEDICATIONS:   Allergies as of 11/04/2020   No Known Allergies      Medication List     STOP taking these medications    traZODone 100 MG tablet Commonly known as: DESYREL       TAKE these medications    acetaminophen 325 MG tablet Commonly known as: TYLENOL Take 650 mg by mouth 3 (three) times daily.   amLODipine 5 MG tablet Commonly known as: NORVASC Take 1 tablet (5 mg total) by mouth daily.   cholecalciferol 25 MCG (1000 UNIT) tablet Commonly known as: VITAMIN D Take 2,000 Units by mouth daily.   divalproex 125 MG capsule Commonly known as: DEPAKOTE SPRINKLE Take 125 mg by mouth 2 (two) times daily.   FLUoxetine 20 MG capsule  Commonly known as: PROZAC Take 20 mg by mouth daily.   methylphenidate 5 MG tablet Commonly known as: RITALIN Take 5 mg by mouth 2 (two) times daily.   risperiDONE 1 MG tablet Commonly known as: RISPERDAL Take 1 mg by mouth at bedtime. What changed: Another medication with the same name was added. Make sure you understand how and when to take each.   risperiDONE 0.5 MG tablet Commonly known as: RISPERDAL Take 1 tablet (0.5 mg total) by mouth 2 (two) times daily  between meals as needed (agitation). What changed: You were already taking a medication with the same name, and this prescription was added. Make sure you understand how and when to take each.   senna-docusate 8.6-50 MG tablet Commonly known as: Senokot-S Take 2 tablets by mouth 2 (two) times daily.   tamsulosin 0.4 MG Caps capsule Commonly known as: FLOMAX Take 0.4 mg by mouth.   vitamin B-12 1000 MCG tablet Commonly known as: CYANOCOBALAMIN Take 1,000 mcg by mouth daily.               Discharge Care Instructions  (From admission, onward)           Start     Ordered   11/03/20 0000  Discharge wound care:       Comments: Wound  Duration          Pressure Injury 10/18/20 Arm Lower;Posterior;Right Deep Tissue Pressure Injury - Purple or maroon localized area of discolored intact skin or blood-filled blister due to damage of underlying soft tissue from pressure and/or shear. 15 days    Wound / Incision (Open or Dehisced) 10/18/20 Knee Anterior;Left 15 days    Wound / Incision (Open or Dehisced) 10/18/20 Knee Anterior;Right 15 days    Wound / Incision (Open or Dehisced) 10/18/20 Other (Comment) Foot Right;Lateral open sore 15 days    Wound / Incision (Open or Dehisced) 10/28/20 Skin tear Arm Left;Lower;Posterior;Proximal scabbed area   11/03/20 1009            If you experience worsening of your admission symptoms, develop shortness of breath, life threatening emergency, suicidal or homicidal thoughts you must seek medical attention immediately by calling 911 or calling your MD immediately  if symptoms less severe.  You Must read complete instructions/literature along with all the possible adverse reactions/side effects for all the Medicines you take and that have been prescribed to you. Take any new Medicines after you have completely understood and accept all the possible adverse reactions/side effects.   Please note  You were cared for by a hospitalist during your  hospital stay. If you have any questions about your discharge medications or the care you received while you were in the hospital after you are discharged, you can call the unit and asked to speak with the hospitalist on call if the hospitalist that took care of you is not available. Once you are discharged, your primary care physician will handle any further medical issues. Please note that NO REFILLS for any discharge medications will be authorized once you are discharged, as it is imperative that you return to your primary care physician (or establish a relationship with a primary care physician if you do not have one) for your aftercare needs so that they can reassess your need for medications and monitor your lab values. Today   SUBJECTIVE   Remains calm. Ate good breakfast. Tells me wants to take a nap VITAL SIGNS:  Blood pressure 130/70, pulse 86, temperature 98.2  F (36.8 C), temperature source Oral, resp. rate 16, height 5\' 5"  (1.651 m), weight 57.4 kg, SpO2 97 %.  I/O:  No intake or output data in the 24 hours ending 11/04/20 0940   PHYSICAL EXAMINATION:  GENERAL:  87 y.o.-year-old patient lying in the bed with no acute distress. Appears chronically fatigued and deconditioned LUNGS: Normal breath sounds bilaterally, no wheezing, rales, rhonchi. CARDIOVASCULAR: S1, S2 normal. No murmurs, rubs, or gallops.  ABDOMEN: Soft, nontender, nondistended.  EXTREMITIES: No cyanosis, clubbing or edema b/l.    NEUROLOGIC: opens eyes to verbal command.alert  and intermittently has conversations Calm   Pressure Injury 10/18/20 Arm Lower;Posterior;Right Deep Tissue Pressure Injury - Purple or maroon localized area of discolored intact skin or blood-filled blister due to damage of underlying soft tissue from pressure and/or shear. (Active)  10/18/20 2000  Location: Arm  Location Orientation: Lower;Posterior;Right (forearm)  Staging: Deep Tissue Pressure Injury - Purple or maroon localized area of  discolored intact skin or blood-filled blister due to damage of underlying soft tissue from pressure and/or shear. (per Wound care note 10/18/20)  Wound Description (Comments):   Present on Admission: Yes   DATA REVIEW:   CBC  No results for input(s): WBC, HGB, HCT, PLT in the last 168 hours.  Chemistries  Recent Labs  Lab 10/31/20 0555  CREATININE 0.72     Microbiology Results   Recent Results (from the past 240 hour(s))  Resp Panel by RT-PCR (Flu A&B, Covid) Nasopharyngeal Swab     Status: None   Collection Time: 11/03/20 12:00 PM   Specimen: Nasopharyngeal Swab; Nasopharyngeal(NP) swabs in vial transport medium  Result Value Ref Range Status   SARS Coronavirus 2 by RT PCR NEGATIVE NEGATIVE Final    Comment: (NOTE) SARS-CoV-2 target nucleic acids are NOT DETECTED.  The SARS-CoV-2 RNA is generally detectable in upper respiratory specimens during the acute phase of infection. The lowest concentration of SARS-CoV-2 viral copies this assay can detect is 138 copies/mL. A negative result does not preclude SARS-Cov-2 infection and should not be used as the sole basis for treatment or other patient management decisions. A negative result may occur with  improper specimen collection/handling, submission of specimen other than nasopharyngeal swab, presence of viral mutation(s) within the areas targeted by this assay, and inadequate number of viral copies(<138 copies/mL). A negative result must be combined with clinical observations, patient history, and epidemiological information. The expected result is Negative.  Fact Sheet for Patients:  11/05/20  Fact Sheet for Healthcare Providers:  BloggerCourse.com  This test is no t yet approved or cleared by the [employer] FDA and  has been authorized for detection and/or diagnosis of SARS-CoV-2 by FDA under an Emergency Use Authorization (EUA). This EUA will remain  in  effect (meaning this test can be used) for the duration of the COVID-19 declaration under Section 564(b)(1) of the Act, 21 U.S.C.section 360bbb-3(b)(1), unless the authorization is terminated  or revoked sooner.       Influenza A by PCR NEGATIVE NEGATIVE Final   Influenza B by PCR NEGATIVE NEGATIVE Final    Comment: (NOTE) The Xpert Xpress SARS-CoV-2/FLU/RSV plus assay is intended as an aid in the diagnosis of influenza from Nasopharyngeal swab specimens and should not be used as a sole basis for treatment. Nasal washings and aspirates are unacceptable for Xpert Xpress SARS-CoV-2/FLU/RSV testing.  Fact Sheet for Patients: [place]  Fact Sheet for Healthcare Providers: BloggerCourse.com  This test is not yet approved or cleared by the [employer]  FDA and has been authorized for detection and/or diagnosis of SARS-CoV-2 by FDA under an Emergency Use Authorization (EUA). This EUA will remain in effect (meaning this test can be used) for the duration of the COVID-19 declaration under Section 564(b)(1) of the Act, 21 U.S.C. section 360bbb-3(b)(1), unless the authorization is terminated or revoked.  Performed at [facility], [address] [place], [place] [address]     RADIOLOGY:  No results found.   CODE STATUS:     Code Status Orders  (From admission, onward)           Start     Ordered   10/24/20 1528  Do not attempt resuscitation (DNR)  Continuous       Question Answer Comment  In the event of cardiac or respiratory ARREST Do not call a "code blue"   In the event of cardiac or respiratory ARREST Do not perform Intubation, CPR, defibrillation or ACLS   In the event of cardiac or respiratory ARREST Use medication by any route, position, wound care, and other measures to relive pain and suffering. May use oxygen, suction and manual treatment of airway obstruction as needed for comfort.   Comments  Patient is a DNR      10/24/20 1528           Code Status History     Date Active Date Inactive Code Status Order ID Comments User Context   10/18/2020 1205 10/20/2020 1911 DNR [id number]  Tresa MooreSreenath, Sudheer B, MD Inpatient   10/17/2020 1518 10/18/2020 1205 Full Code [id number]  Cox, Nadyne CoombesAmy N, DO ED      Advance Directive Documentation    Flowsheet Row Most Recent Value  Type of Advance Directive Healthcare Power of Attorney  Pre-existing out of facility DNR order (yellow form or pink MOST form) --  "MOST" Form in Place? --        TOTAL TIME TAKING CARE OF THIS PATIENT: 35 minutes.    Enedina FinnerSona Raford Brissett M.D  [facility]    CC: Primary care physician; [facility]

## 2020-11-24 ENCOUNTER — Ambulatory Visit: Payer: Self-pay | Admitting: Podiatry

## 2022-01-19 DEATH — deceased

## 2022-12-05 IMAGING — CR DG KNEE COMPLETE 4+V*L*
2 series · 2 of 2 positions shown · non-contrast
Comparison: None.

CLINICAL DATA: Un witnessed fall.

EXAM:
LEFT KNEE - COMPLETE 4+ VIEW

[knee ap]
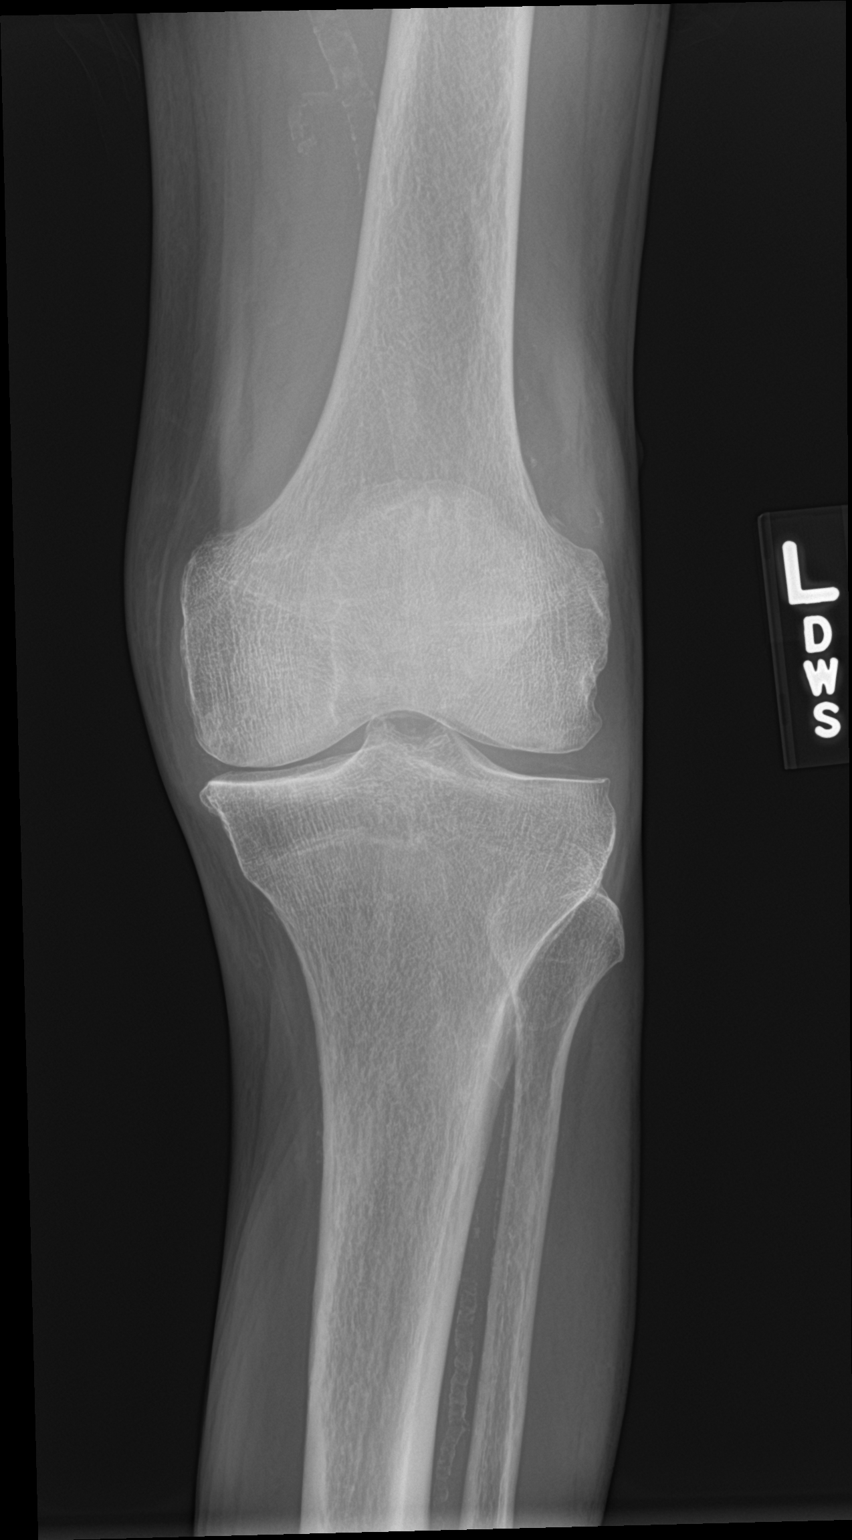

[knee obl]
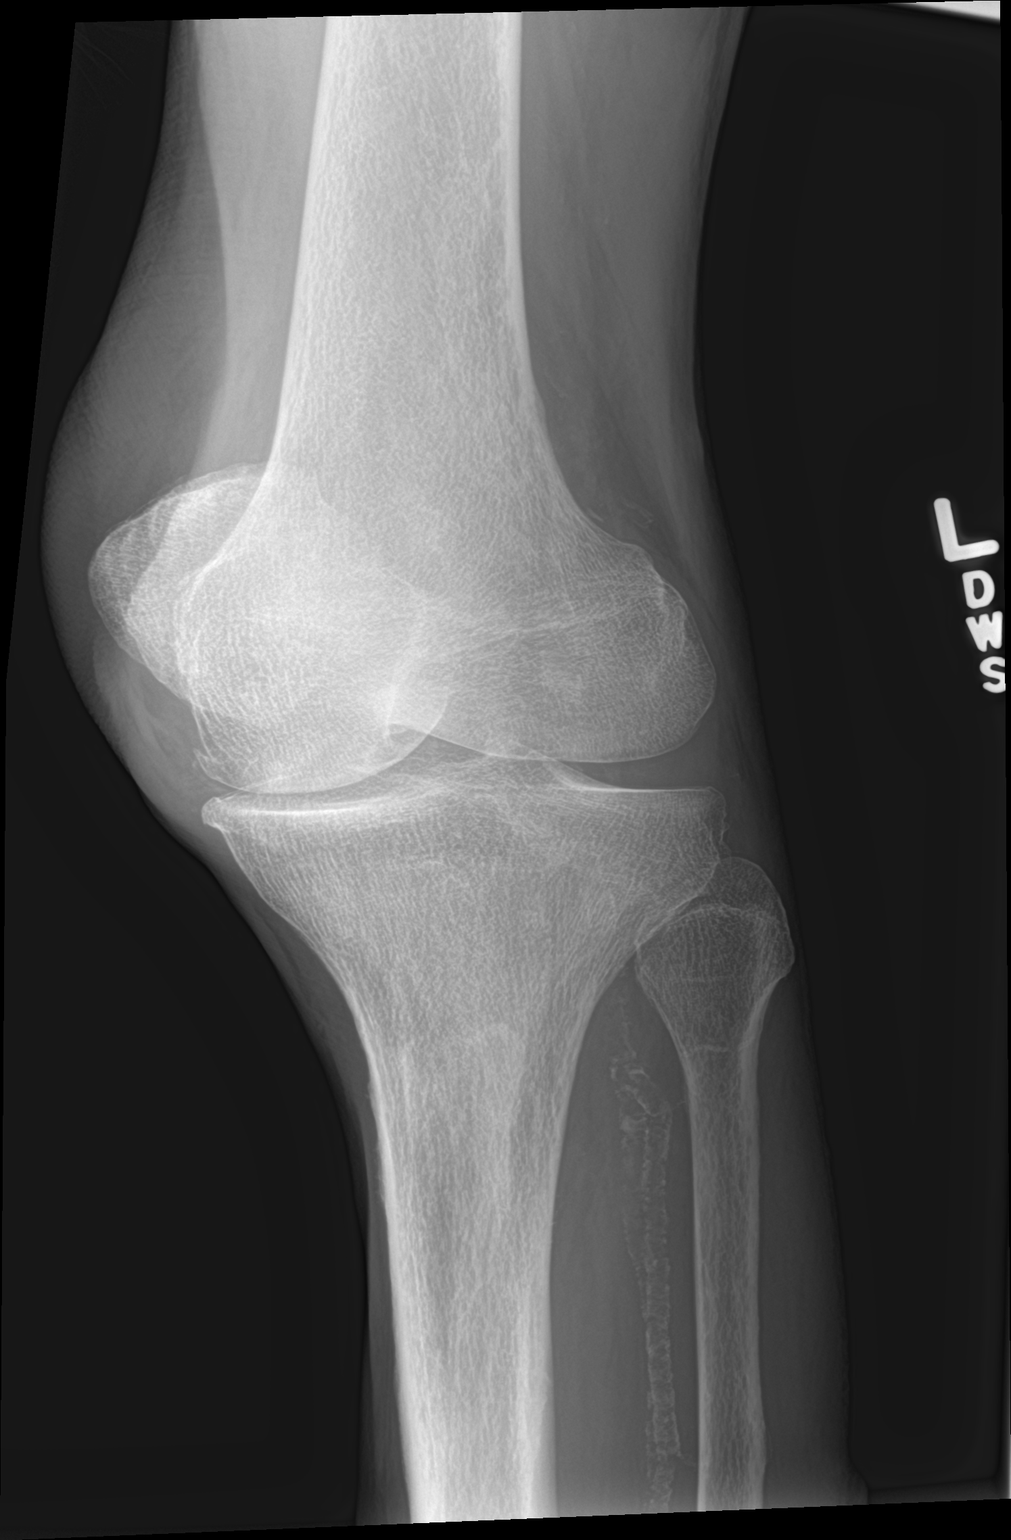

[2 of 2 positions shown; findings below may reference images not displayed]

FINDINGS: No evidence of fracture, dislocation, or joint effusion. Moderate
medial compartment osteoarthritis. Soft tissues are unremarkable.
IMPRESSION: 1. No acute findings.
2. Moderate medial compartment osteoarthritis.

## 2022-12-08 IMAGING — CT CT CERVICAL SPINE W/O CM
3 of 6 series · 13 of 36 positions shown, 14 images · non-contrast
Comparison: Cervical spine CT 3 days ago 10/17/2020

CLINICAL DATA: Neck trauma (Age >= 65y)

Unwitnessed fall.
EXAM:
CT CERVICAL SPINE WITHOUT CONTRAST
TECHNIQUE: Multidetector CT imaging of the cervical spine was performed without
intravenous contrast. Multiplanar CT image reconstructions were also
generated.

[Series 4: sagittal bone · sagittal · 0.29mm/px · 6 of 77 slices shown]
[im 5/77  soft-tissue]
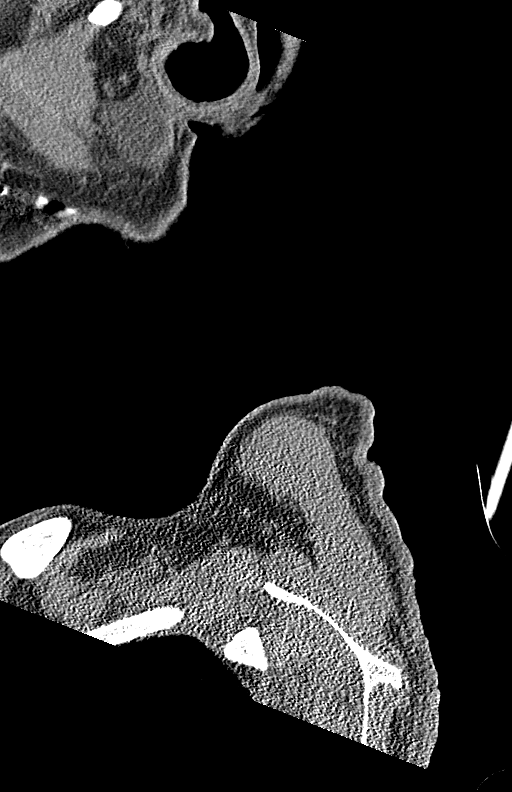
[im 13/77  bone]
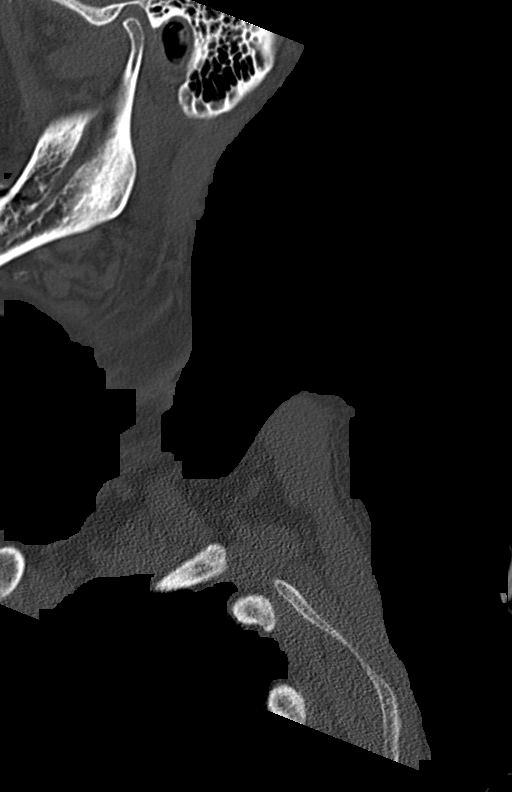
[im 26/77  bone]
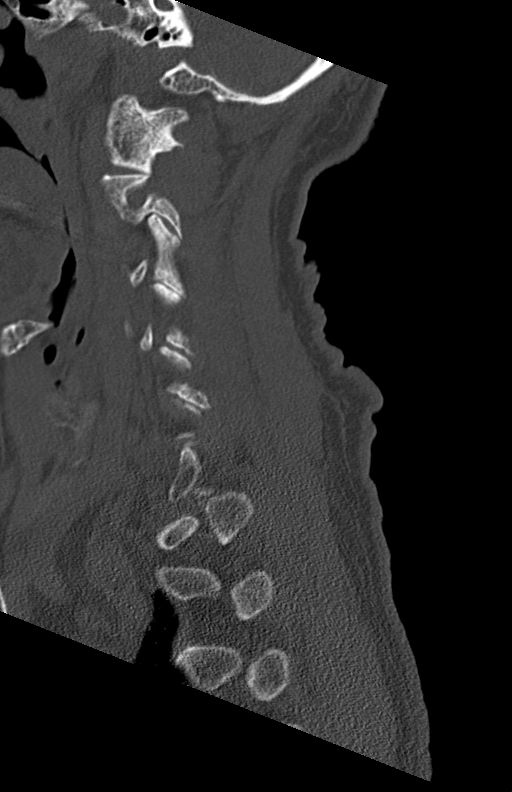
[im 39/77  bone]
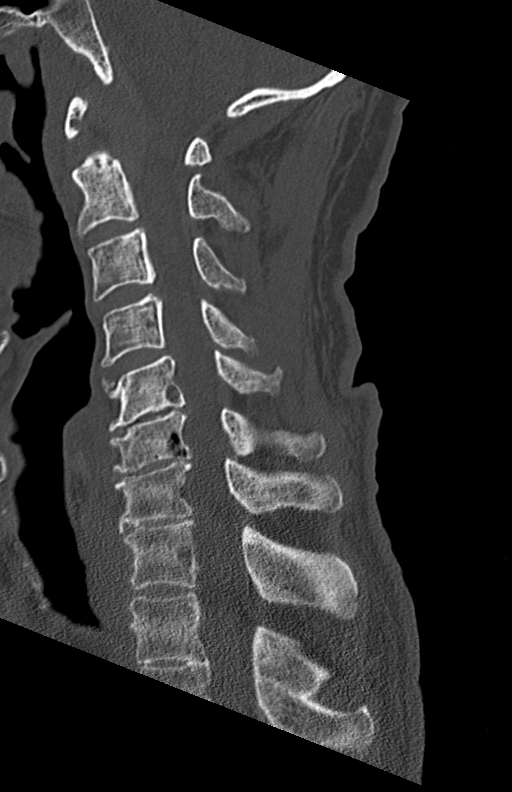
[im 51/77  bone]
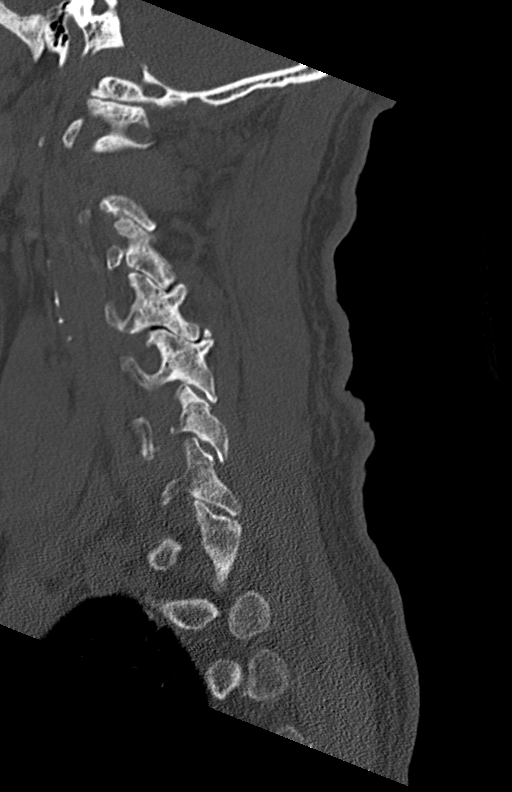
[im 64/77  bone]
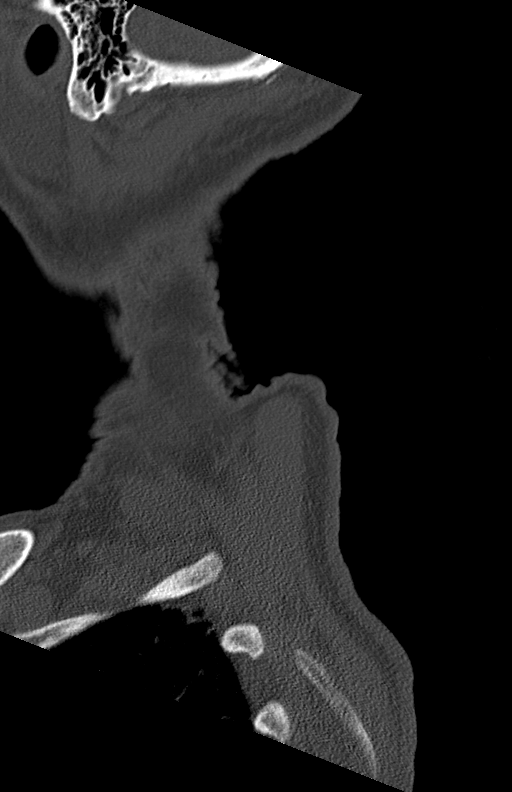

[Series 6: orthogonal bone · axial · 0.29mm/px · z∈[+318,+472]mm · 4 of 117 slices shown, 5 images]
[im 17/117  soft-tissue]
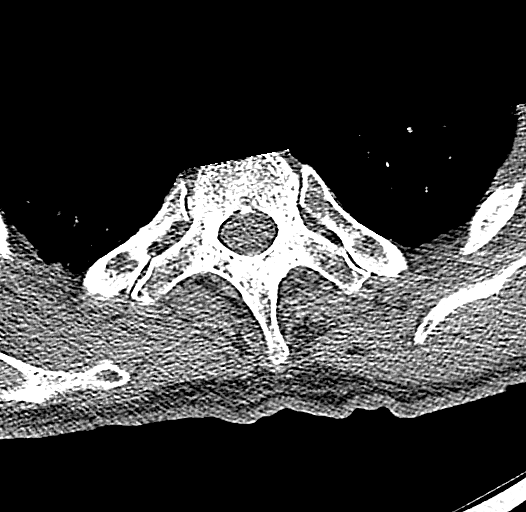
[im 17/117  bone]
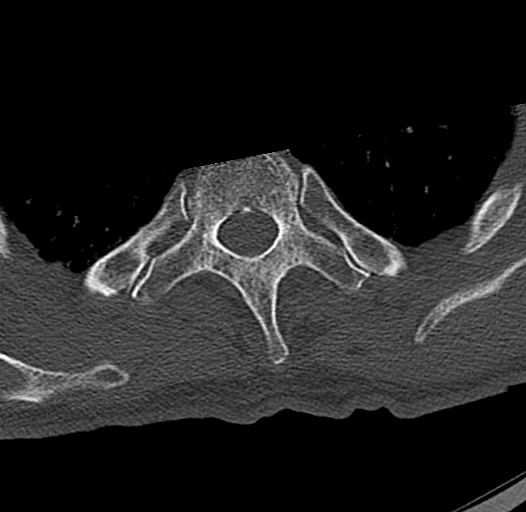
[im 50/117  bone]
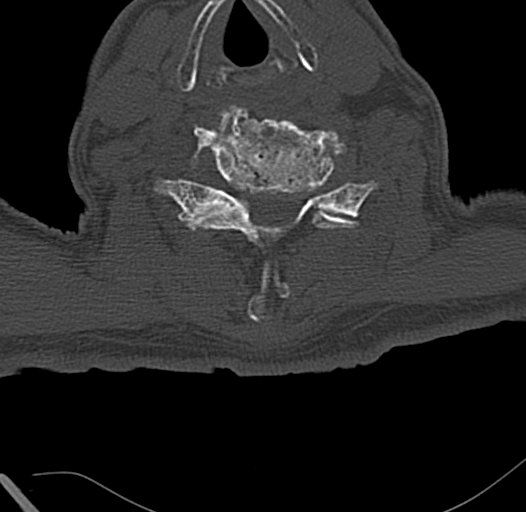
[im 67/117  bone]
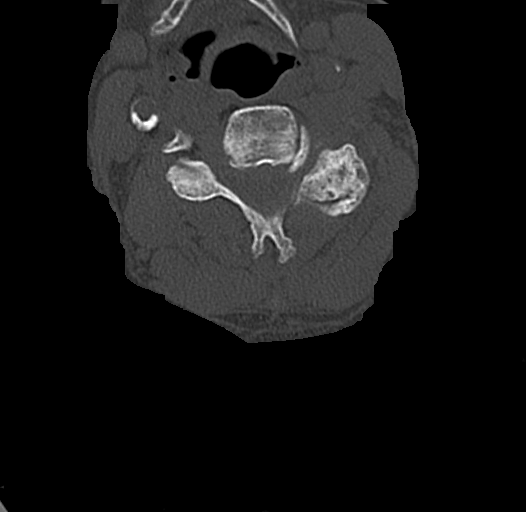
[im 100/117  bone]
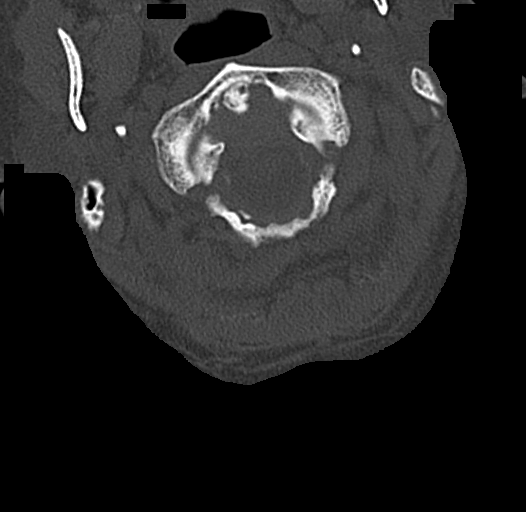

[Series 9: coronal bone 2 · coronal · 0.30mm/px · 3 of 75 slices shown]
[im 5/75  bone]
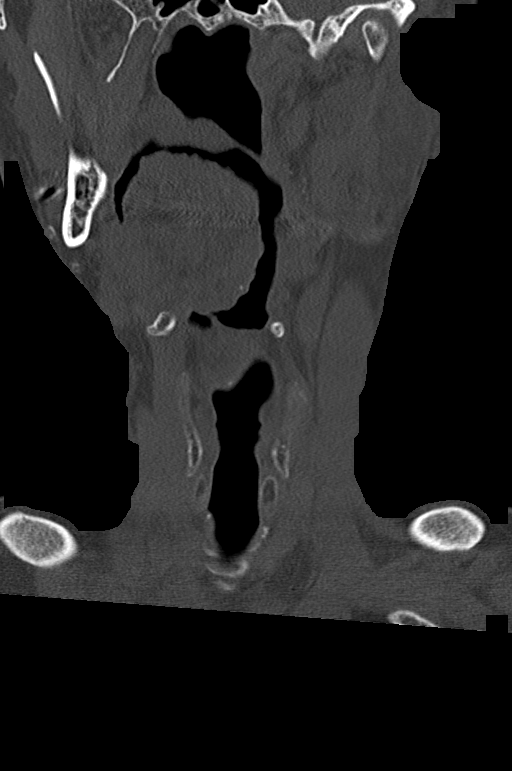
[im 38/75  bone]
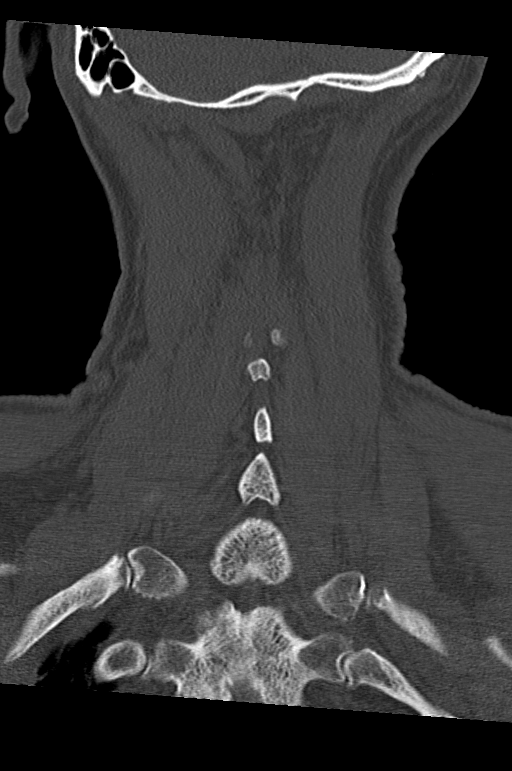
[im 71/75  bone]
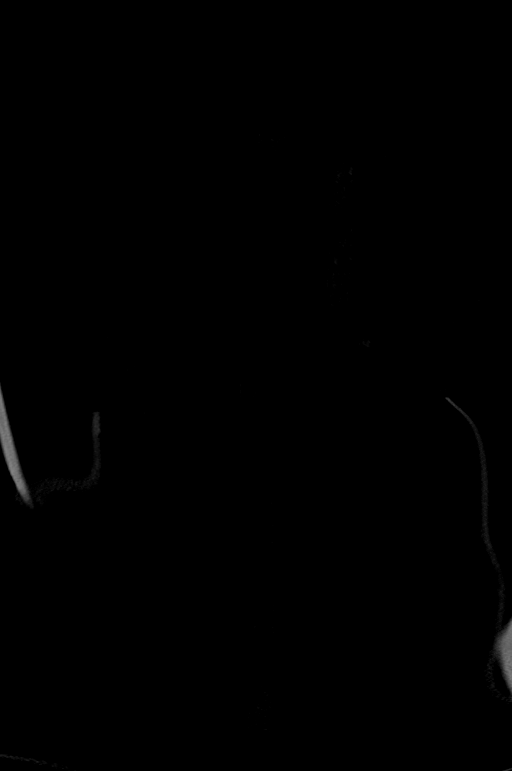

[13 of 36 positions shown; findings below may reference images not displayed]

FINDINGS: Alignment: Straightening of normal lordosis. No traumatic
subluxation.

Skull base and vertebrae: No acute fracture. Vertebral body heights
are maintained. The dens and skull base are intact. Stable
degenerative pannus at C1-C2.

Soft tissues and spinal canal: No prevertebral fluid or swelling. No
visible canal hematoma.

Disc levels: Diffuse degenerative disc disease and facet
hypertrophy. No significant change from prior exam.

Upper chest: Biapical pleuroparenchymal scarring. No acute findings.

Other: None.
IMPRESSION: 1. No acute fracture or subluxation of the cervical spine.
2. Multilevel degenerative disc disease and facet hypertrophy.
